# Patient Record
Sex: Female | Born: 1955 | Race: White | Hispanic: No | Marital: Married
Health system: Midwestern US, Community
[De-identification: ages and names within clinical notes are randomized; demographics above are authoritative.]

## PROBLEM LIST (undated history)

## (undated) DIAGNOSIS — M19011 Primary osteoarthritis, right shoulder: Secondary | ICD-10-CM

## (undated) DIAGNOSIS — G8929 Other chronic pain: Secondary | ICD-10-CM

## (undated) DIAGNOSIS — M5416 Radiculopathy, lumbar region: Principal | ICD-10-CM

## (undated) DIAGNOSIS — M4120 Other idiopathic scoliosis, site unspecified: Secondary | ICD-10-CM

## (undated) DIAGNOSIS — M545 Low back pain, unspecified: Secondary | ICD-10-CM

---

## 2015-03-10 ENCOUNTER — Inpatient Hospital Stay: Admit: 2015-03-10 | Payer: BLUE CROSS/BLUE SHIELD | Primary: Internal Medicine

## 2015-03-10 DIAGNOSIS — Z01812 Encounter for preprocedural laboratory examination: Secondary | ICD-10-CM

## 2015-03-10 LAB — POTASSIUM: Potassium: 3.5 mmol/L (ref 3.5–5.1)

## 2015-03-10 NOTE — Other (Signed)
Patient verified name, DOB, and surgery as listed in [facility].    TYPE  CASE:1b  Orders per surgeon:  Received  Labs per surgeon:none  Labs per anesthesia protocol: potassium EKG  :  Not needed      Patient provided with handouts including guide to surgery , transfusions, pain management and hand hygiene for the family and community. Pt verbalizes understanding of all pre-op instructions .  Instructed that family must be present in building at all times.    Hibiclens and instructions given per hospital policy.      Instructed patient to continue  previous medications as prescribed prior to surgery and  to take the following medications the day of surgery according to anesthesia guidelines : allegra, singulair         Continue all previous medications unless otherwise directed.      Instructed patient to hold  the following medications prior to surgery: diclofenac, multivitamin      Patient verbalized understanding of all instructions and provided all medical/health information to the best of their ability.

## 2015-03-11 ENCOUNTER — Inpatient Hospital Stay: Payer: BLUE CROSS/BLUE SHIELD

## 2015-03-11 MED ORDER — DIPHENHYDRAMINE HCL 50 MG/ML IJ SOLN
50 mg/mL | Freq: Once | INTRAMUSCULAR | Status: DC | PRN
Start: 2015-03-11 — End: 2015-03-11

## 2015-03-11 MED ORDER — SODIUM CHLORIDE 0.9 % INJECTION
INTRAMUSCULAR | Status: AC
Start: 2015-03-11 — End: ?

## 2015-03-11 MED ORDER — ALBUTEROL SULFATE 0.083 % (0.83 MG/ML) SOLN FOR INHALATION
2.5 mg /3 mL (0.083 %) | RESPIRATORY_TRACT | Status: DC | PRN
Start: 2015-03-11 — End: 2015-03-11

## 2015-03-11 MED ORDER — ONDANSETRON (PF) 4 MG/2 ML INJECTION
4 mg/2 mL | Freq: Once | INTRAMUSCULAR | Status: DC
Start: 2015-03-11 — End: 2015-03-11

## 2015-03-11 MED ORDER — ROPIVACAINE (PF) 5 MG/ML (0.5 %) INJECTION
5 mg/mL (0. %) | INTRAMUSCULAR | Status: DC | PRN
Start: 2015-03-11 — End: 2015-03-11
  Administered 2015-03-11: 12:00:00 via PERINEURAL

## 2015-03-11 MED ORDER — LACTATED RINGERS IV
INTRAVENOUS | Status: DC
Start: 2015-03-11 — End: 2015-03-11

## 2015-03-11 MED ORDER — LIDOCAINE HCL 1 % (10 MG/ML) IJ SOLN
10 mg/mL (1 %) | INTRAMUSCULAR | Status: DC | PRN
Start: 2015-03-11 — End: 2015-03-11

## 2015-03-11 MED ORDER — VANCOMYCIN 1,000 MG IV SOLR
1000 mg | Freq: Once | INTRAVENOUS | Status: AC
Start: 2015-03-11 — End: 2015-03-11
  Administered 2015-03-11: 11:00:00 via INTRAVENOUS

## 2015-03-11 MED ORDER — LACTATED RINGERS IV
INTRAVENOUS | Status: DC
Start: 2015-03-11 — End: 2015-03-11
  Administered 2015-03-11: 11:00:00 via INTRAVENOUS

## 2015-03-11 MED ORDER — EPINEPHRINE (PF) 1 MG/ML INJECTION
1 mg/mL ( mL) | INTRAMUSCULAR | Status: AC
Start: 2015-03-11 — End: ?

## 2015-03-11 MED ORDER — EPINEPHRINE 1 MG/ML IJ SOLN
1 mg/mL | INTRAMUSCULAR | Status: DC | PRN
Start: 2015-03-11 — End: 2015-03-11
  Administered 2015-03-11: 12:00:00

## 2015-03-11 MED ORDER — OXYCODONE 5 MG TAB
5 mg | Freq: Once | ORAL | Status: DC | PRN
Start: 2015-03-11 — End: 2015-03-11

## 2015-03-11 MED ORDER — PROPOFOL 10 MG/ML IV EMUL
10 mg/mL | INTRAVENOUS | Status: AC
Start: 2015-03-11 — End: ?

## 2015-03-11 MED ORDER — NALOXONE 0.4 MG/ML INJECTION
0.4 mg/mL | INTRAMUSCULAR | Status: DC | PRN
Start: 2015-03-11 — End: 2015-03-11

## 2015-03-11 MED ORDER — HYDROMORPHONE (PF) 2 MG/ML IJ SOLN
2 mg/mL | INTRAMUSCULAR | Status: DC | PRN
Start: 2015-03-11 — End: 2015-03-11

## 2015-03-11 MED ORDER — PROPOFOL 10 MG/ML IV EMUL
10 mg/mL | INTRAVENOUS | Status: DC | PRN
Start: 2015-03-11 — End: 2015-03-11
  Administered 2015-03-11: 13:00:00 via INTRAVENOUS

## 2015-03-11 MED ORDER — ONDANSETRON (PF) 4 MG/2 ML INJECTION
4 mg/2 mL | INTRAMUSCULAR | Status: AC
Start: 2015-03-11 — End: ?

## 2015-03-11 MED ORDER — MIDAZOLAM 1 MG/ML IJ SOLN
1 mg/mL | Freq: Once | INTRAMUSCULAR | Status: AC
Start: 2015-03-11 — End: 2015-03-11
  Administered 2015-03-11: 12:00:00 via INTRAVENOUS

## 2015-03-11 MED ORDER — MIDAZOLAM 1 MG/ML IJ SOLN
1 mg/mL | Freq: Once | INTRAMUSCULAR | Status: DC | PRN
Start: 2015-03-11 — End: 2015-03-11

## 2015-03-11 MED ORDER — BUPIVACAINE-EPINEPHRINE (PF) 0.25 %-1:200,000 IJ SOLN
0.25 %-1:200,000 | INTRAMUSCULAR | Status: DC | PRN
Start: 2015-03-11 — End: 2015-03-11
  Administered 2015-03-11: 12:00:00 via PERINEURAL

## 2015-03-11 MED ORDER — LIDOCAINE (PF) 20 MG/ML (2 %) IJ SOLN
20 mg/mL (2 %) | INTRAMUSCULAR | Status: DC | PRN
Start: 2015-03-11 — End: 2015-03-11
  Administered 2015-03-11: 13:00:00 via INTRAVENOUS

## 2015-03-11 MED ORDER — PHENYLEPHRINE 10 MG/ML INJECTION
10 mg/mL | INTRAMUSCULAR | Status: AC
Start: 2015-03-11 — End: ?

## 2015-03-11 MED ORDER — DEXAMETHASONE SODIUM PHOSPHATE 4 MG/ML IJ SOLN
4 mg/mL | INTRAMUSCULAR | Status: DC | PRN
Start: 2015-03-11 — End: 2015-03-11
  Administered 2015-03-11: 13:00:00 via INTRAVENOUS

## 2015-03-11 MED ORDER — ONDANSETRON (PF) 4 MG/2 ML INJECTION
4 mg/2 mL | INTRAMUSCULAR | Status: DC | PRN
Start: 2015-03-11 — End: 2015-03-11
  Administered 2015-03-11: 13:00:00 via INTRAVENOUS

## 2015-03-11 MED ORDER — KETOROLAC TROMETHAMINE 30 MG/ML INJECTION
30 mg/mL (1 mL) | INTRAMUSCULAR | Status: DC
Start: 2015-03-11 — End: 2015-03-11

## 2015-03-11 MED ORDER — DEXAMETHASONE SODIUM PHOSPHATE 4 MG/ML IJ SOLN
4 mg/mL | INTRAMUSCULAR | Status: AC
Start: 2015-03-11 — End: ?

## 2015-03-11 MED ORDER — LIDOCAINE (PF) 20 MG/ML (2 %) IV SYRINGE
100 mg/5 mL (2 %) | INTRAVENOUS | Status: AC
Start: 2015-03-11 — End: ?

## 2015-03-11 MED ORDER — EPHEDRINE SULFATE 50 MG/ML IJ SOLN
50 mg/mL | INTRAMUSCULAR | Status: AC
Start: 2015-03-11 — End: ?

## 2015-03-11 MED ORDER — AZTREONAM 2 GRAM SOLUTION FOR INJECTION
2 gram | Freq: Once | INTRAMUSCULAR | Status: AC
Start: 2015-03-11 — End: 2015-03-11
  Administered 2015-03-11 (×2): via INTRAVENOUS

## 2015-03-11 MED ORDER — FENTANYL CITRATE (PF) 50 MCG/ML IJ SOLN
50 mcg/mL | Freq: Once | INTRAMUSCULAR | Status: DC
Start: 2015-03-11 — End: 2015-03-11

## 2015-03-11 MED FILL — PROPOFOL 10 MG/ML IV EMUL: 10 mg/mL | INTRAVENOUS | Qty: 20

## 2015-03-11 MED FILL — VANCOMYCIN 1,000 MG IV SOLR: 1000 mg | INTRAVENOUS | Qty: 1000

## 2015-03-11 MED FILL — ONDANSETRON (PF) 4 MG/2 ML INJECTION: 4 mg/2 mL | INTRAMUSCULAR | Qty: 2

## 2015-03-11 MED FILL — MIDAZOLAM 1 MG/ML IJ SOLN: 1 mg/mL | INTRAMUSCULAR | Qty: 2

## 2015-03-11 MED FILL — AZTREONAM 2 GRAM SOLUTION FOR INJECTION: 2 gram | INTRAMUSCULAR | Qty: 2

## 2015-03-11 MED FILL — LIDOCAINE (PF) 20 MG/ML (2 %) IV SYRINGE: 100 mg/5 mL (2 %) | INTRAVENOUS | Qty: 5

## 2015-03-11 MED FILL — SODIUM CHLORIDE 0.9 % INJECTION: INTRAMUSCULAR | Qty: 20

## 2015-03-11 MED FILL — DEXAMETHASONE SODIUM PHOSPHATE 4 MG/ML IJ SOLN: 4 mg/mL | INTRAMUSCULAR | Qty: 1

## 2015-03-11 MED FILL — PHENYLEPHRINE 10 MG/ML INJECTION: 10 mg/mL | INTRAMUSCULAR | Qty: 1

## 2015-03-11 MED FILL — EPINEPHRINE (PF) 1 MG/ML INJECTION: 1 mg/mL ( mL) | INTRAMUSCULAR | Qty: 2

## 2015-03-11 MED FILL — EPHEDRINE SULFATE 50 MG/ML IJ SOLN: 50 mg/mL | INTRAMUSCULAR | Qty: 1

## 2015-03-11 NOTE — Anesthesia Procedure Notes (Addendum)
Peripheral Block    Start time: 03/11/2015 7:03 AM  End time: 03/11/2015 7:06 AM  Performed by: Tobiah Celestine D  Authorized by: Ferry Matthis D       Pre-procedure:   Indications: at surgeon's request and post-op pain management    Preanesthetic Checklist: patient identified, risks and benefits discussed, site marked, timeout performed, anesthesia consent given and patient being monitored    Timeout Time: 07:03          Block Type:   Block Type:  Interscalene  Laterality:  Right  Monitoring:  Standard ASA monitoring, continuous pulse ox, frequent vital sign checks, heart rate, oxygen and responsive to questions  Injection Technique:  Single shot  Procedures: ultrasound guided    Patient Position: supine  Prep: chlorhexidine    Location:  Interscalene  Needle Type:  Stimuplex  Needle Gauge:  21 G  Needle Localization:  Ultrasound guidance and anatomical landmarks  Medication Injected:  0.25%  bupivacaine  Adds:  Epi 1:200K  Volume (mL):  10  ropivacaine  Volume (mL):  20    Assessment:  Number of attempts:  1  Injection Assessment:  Incremental injection every 5 mL, local visualized surrounding nerve on ultrasound, negative aspiration for blood, no paresthesia, no intravascular symptoms and ultrasound image on chart  Patient tolerance:  Patient tolerated the procedure well with no immediate complications

## 2015-03-11 NOTE — Op Note (Signed)
Dictated.

## 2015-03-11 NOTE — Anesthesia Post-Procedure Evaluation (Signed)
Post-Anesthesia Evaluation and Assessment    Patient: Monique Barker MRN: [id number]  SSN: [id number]    Date of Birth: 08/20/1955  Age: 59 y.o.  Sex: female       Cardiovascular Function/Vital Signs  Visit Vitals   ??? BP 131/71 (BP 1 Location: Left arm, BP Patient Position: Supine)   ??? Pulse 80   ??? Temp 36.4 ??C (97.5 ??F)   ??? Resp 13   ??? SpO2 98%       Patient is status post general anesthesia for Procedure(s):  SHOULDER ARTHROSCOPY RIGHT/SUBACROMINAL DECOMPRESSION/IDTAL CLAVICLE RESECTION.    Nausea/Vomiting: None    Postoperative hydration reviewed and adequate.    Pain:  Pain Scale 1: Numeric (0 - 10) (03/11/15 0850)  Pain Intensity 1: 0 (03/11/15 0850)   Managed    Neurological Status:   Neuro (WDL): Exceptions to WDL (03/11/15 0850)  Neuro  RUE Motor Response: Pharmocologically paralyzed (03/11/15 0850)   At baseline    Mental Status and Level of Consciousness: Arousable    Pulmonary Status:   O2 Device: Room air (03/11/15 0850)   Adequate oxygenation and airway patent    Complications related to anesthesia: None    Post-anesthesia assessment completed. No concerns    Signed By: Waymond CeraJohn D Deacon Gadbois, MD     March 11, 2015

## 2015-03-11 NOTE — Anesthesia Post-Procedure Evaluation (Signed)
Post-Anesthesia Evaluation and Assessment    Patient: Monique Barker MRN: [id number]  SSN: [id number]    Date of Birth: 09/08/1955  Age: 59 y.o.  Sex: female       Cardiovascular Function/Vital Signs  Visit Vitals   ??? BP 133/64   ??? Pulse 90   ??? Temp 36.4 ??C (97.5 ??F)   ??? Resp 14   ??? SpO2 97%       Patient is status post general anesthesia for Procedure(s):  SHOULDER ARTHROSCOPY RIGHT/SUBACROMINAL DECOMPRESSION/IDTAL CLAVICLE RESECTION.    Nausea/Vomiting: None    Postoperative hydration reviewed and adequate.    Pain:  Pain Scale 1: Visual (03/11/15 0820)  Pain Intensity 1: 0 (03/11/15 0820)   Managed    Neurological Status:   Neuro (WDL): Within Defined Limits (03/11/15 0820)   At baseline    Mental Status and Level of Consciousness: Arousable    Pulmonary Status:   O2 Device: Nasal cannula;Oral airway (03/11/15 0820)   Adequate oxygenation and airway patent    Complications related to anesthesia: None    Post-anesthesia assessment completed. No concerns    Signed By: Waymond CeraJohn D Ricki Clack, MD     March 11, 2015

## 2015-03-11 NOTE — Op Note (Signed)
[facility]   OPERATIVE REPORT       Name:  Monique ManeWILLIAMS, Kailei   MR#:  [id number]   DOB:  25-Sep-1955   Account #:  [id number]   Date of Adm:  03/11/2015       DATE OF SURGERY: 03/11/2015      PREOPERATIVE DIAGNOSES:   1. Acromioclavicular arthritis.   2. Impingement, right shoulder.      POSTOPERATIVE DIAGNOSES:   1. Acromioclavicular arthritis.   2. Impingement, right shoulder.      NAME OF PROCEDURE:   1. Arthroscopic distal clavicle excision (Mumford procedure).   2. Arthroscopic subacromial decompression, right shoulder.    SURGEON: Germaine Pomfrethomas E. Kenley Rettinger, MD.    ASSISTANT: Gayla Dossarl Howell, PA.    ANESTHESIA: General.    FLUIDS: Crystalloid.    ESTIMATED BLOOD LOSS: Minimal.    DESCRIPTION OF PROCEDURE: After informed consent, the patient   was brought to the operating room and placed on the table in   supine position. General endotracheal anesthesia was   administered without difficulty. The patient was placed in left   lateral decubitus position. All pressure points were inspected   and padded appropriately. Right upper extremity suspended in   overhead traction. The right shoulder was prepped and draped in   sterile fashion. Glenohumeral joint was insufflated with 50 mL   of sterile saline and a posterior portal was established.   Glenohumeral joint was then arthroscoped in sequential manner.   The biceps tendon, rotator cuff, glenohumeral articular surfaces   were intact. There was a small type 1 SLAP tear, otherwise no   abnormalities in the glenohumeral joint. An anterior portal was   established. The small superior labral tear was debrided back to   a smooth, stable rim. The scope was brought in the subacromial   space. A lateral portal was established. Bursa and proliferative   tissue was excised. The superior surface of the rotator cuff had   some mild fraying, but was otherwise intact. There was an   anterior/inferior acromial slope. There was undersurface    osteophyte formation and degenerative change of the distal   clavicle at the [facility] joint. The undersurface of the acromion was   exposed and an acromioplasty was performed. All the acromion   anterior to the leading edge of distal clavicle was excised to a   depth of 5 to 6 mm and a 2 cm anterior to posterior direction   was removed. This opened up the subacromial space nicely.   Attention was then directed to the distal clavicle. Through both   the anterior and lateral portals, a circumferential distal   clavicle excision was performed. Approximately 10 mm of distal   clavicle was excised. Circumferential excision was verified   arthroscopically. The Mumford procedure was performed without   difficulty. The shoulder was thoroughly irrigated, portals   closed. Sterile dressings were applied. The patient was   extubated and taken to the recovery room in stable condition.      Gayla Dossarl Howell, PA, assisted during the procedure. He was necessary   for patient positioning, wound closure and the assistance with   the major portions of the procedure. His presence decreased the   operative time and potential complication rate.        Germaine PomfretHOMAS E. Kariem Wolfson, MD      [facility] / [facility]   D:  03/11/2015   08:16   T:  03/11/2015   08:33   Job #:  [id number]

## 2015-03-11 NOTE — H&P (Signed)
Outpatient Surgery History and Physical:  Monique Barker was seen and examined.    CHIEF COMPLAINT:   Right shoulder pain.Marland Kitchen.     PE:     Visit Vitals   ??? BP 129/65 (BP 1 Location: Left arm, BP Patient Position: Supine)   ??? Pulse 71   ??? Temp 97.7 ??F (36.5 ??C)   ??? Resp 18   ??? SpO2 98%       Heart:   Regular rhythm      Lungs:  Are clear      Past Medical History:  There are no active problems to display for this patient.      Surgical History:   Past Surgical History   Procedure Laterality Date   ??? Pr abdomen surgery proc unlisted       chole   ??? Hx orthopaedic       left ankle       Social History: Patient  reports that she has never smoked. She does not have any smokeless tobacco history on file. She reports that she does not drink alcohol.    Family History:   Family History   Problem Relation Age of Onset   ??? Diabetes Mother      borddrline   ??? Cancer Mother    ??? Parkinson's Disease Father    ??? Cancer Father      different skin ca   ??? Cancer Sister    ??? Cancer Sister      brain tumor on thalmus       Allergies: Reviewed per EMR  No Known Allergies    Medications:    No current facility-administered medications on file prior to encounter.      No current outpatient prescriptions on file prior to encounter.       The surgery is planned for the right shoulder.        History and physical has been reviewed. The patient has been examined. There have been no significant clinical changes since the completion of the originally dated History and Physical.  Patient identified by surgeon; surgical site was confirmed by patient and surgeon.      The patient is here today for outpatient surgery. I have examined the patient, no changes are noted in the patient's medical status. Necessity for the procedure/care is still present and the history and physical above is current.  See the office notes for the full long term history of the problem.  Please see the recent office notes for the musculoskeletal examination.     Signed By: Olen Cordialarl A Dessa Ledee, PA     March 11, 2015 6:48 AM

## 2015-03-11 NOTE — Anesthesia Pre-Procedure Evaluation (Signed)
Anesthetic History               Review of Systems / Medical History  Patient summary reviewed, nursing notes reviewed and pertinent labs reviewed    Pulmonary                   Neuro/Psych              Cardiovascular                  Exercise tolerance: >4 METS     GI/Hepatic/Renal                Endo/Other             Other Findings              Physical Exam    Airway  Mallampati: II  TM Distance: 4 - 6 cm  Neck ROM: normal range of motion   Mouth opening: Normal     Cardiovascular  Regular rate and rhythm,  S1 and S2 normal,  no murmur, click, rub, or gallop             Dental  No notable dental hx       Pulmonary  Breath sounds clear to auscultation               Abdominal         Other Findings            Anesthetic Plan    ASA: 1  Anesthesia type: general      Post-op pain plan if not by surgeon: peripheral nerve block single    Induction: Intravenous  Anesthetic plan and risks discussed with: Patient and Spouse

## 2015-03-11 NOTE — Other (Signed)
Discharge instructions given to spouse. Pt dressed and sling placed over shirt. Verbalized understanding and opportunity for questions was given. Dr Winifred Olivesherbert okay to discharge at this time. Pt and belongings taken via wheelchair to car.

## 2015-03-11 NOTE — Anesthesia Procedure Notes (Signed)
Peripheral Block    Start time: 03/11/2015 7:03 AM  End time: 03/11/2015 7:06 AM  Performed by: Waymond CeraSHERBERT, Keyden Pavlov D  Authorized by: Oletta DarterSHERBERT, Yoko Mcgahee D       Pre-procedure:   Indications: at surgeon's request and post-op pain management    Preanesthetic Checklist: patient identified, risks and benefits discussed, site marked, timeout performed, anesthesia consent given and patient being monitored    Timeout Time: 07:03          Block Type:   Block Type:  Interscalene  Laterality:  Right  Monitoring:  Standard ASA monitoring, continuous pulse ox, frequent vital sign checks, heart rate, oxygen and responsive to questions  Injection Technique:  Single shot  Procedures: ultrasound guided    Patient Position: supine  Prep: chlorhexidine    Location:  Interscalene  Needle Type:  Stimuplex  Needle Gauge:  21 G  Needle Localization:  Ultrasound guidance and anatomical landmarks  Medication Injected:  0.25%  bupivacaine  Adds:  Epi 1:200K  Volume (mL):  10  ropivacaine  Volume (mL):  20    Assessment:  Number of attempts:  1  Injection Assessment:  Incremental injection every 5 mL, local visualized surrounding nerve on ultrasound, negative aspiration for blood, no paresthesia, no intravascular symptoms and ultrasound image on chart  Patient tolerance:  Patient tolerated the procedure well with no immediate complications

## 2015-03-11 NOTE — Op Note (Signed)
[facility]   OPERATIVE REPORT       Name:  Monique Barker, Monique Barker   MR#:  [id number]   DOB:  August 12, 1955   Account #:  [id number]   Date of Adm:  03/11/2015       DATE OF SURGERY: 03/11/2015      PREOPERATIVE DIAGNOSES:   1. Acromioclavicular arthritis.   2. Impingement, right shoulder.      POSTOPERATIVE DIAGNOSES:   1. Acromioclavicular arthritis.   2. Impingement, right shoulder.      NAME OF PROCEDURE:   1. Arthroscopic distal clavicle excision (Mumford procedure).   2. Arthroscopic subacromial decompression, right shoulder.    SURGEON: Germaine Pomfret, MD.    ASSISTANT: Gayla Doss, PA.    ANESTHESIA: General.    FLUIDS: Crystalloid.    ESTIMATED BLOOD LOSS: Minimal.    DESCRIPTION OF PROCEDURE: After informed consent, the patient   was brought to the operating room and placed on the table in   supine position. General endotracheal anesthesia was   administered without difficulty. The patient was placed in left   lateral decubitus position. All pressure points were inspected   and padded appropriately. Right upper extremity suspended in   overhead traction. The right shoulder was prepped and draped in   sterile fashion. Glenohumeral joint was insufflated with 50 mL   of sterile saline and a posterior portal was established.   Glenohumeral joint was then arthroscoped in sequential manner.   The biceps tendon, rotator cuff, glenohumeral articular surfaces   were intact. There was a small type 1 SLAP tear, otherwise no   abnormalities in the glenohumeral joint. An anterior portal was   established. The small superior labral tear was debrided back to   a smooth, stable rim. The scope was brought in the subacromial   space. A lateral portal was established. Bursa and proliferative   tissue was excised. The superior surface of the rotator cuff had   some mild fraying, but was otherwise intact. There was an   anterior/inferior acromial slope. There was undersurface   osteophyte formation and  degenerative change of the distal   clavicle at the [facility] joint. The undersurface of the acromion was   exposed and an acromioplasty was performed. All the acromion   anterior to the leading edge of distal clavicle was excised to a   depth of 5 to 6 mm and a 2 cm anterior to posterior direction   was removed. This opened up the subacromial space nicely.   Attention was then directed to the distal clavicle. Through both   the anterior and lateral portals, a circumferential distal   clavicle excision was performed. Approximately 10 mm of distal   clavicle was excised. Circumferential excision was verified   arthroscopically. The Mumford procedure was performed without   difficulty. The shoulder was thoroughly irrigated, portals   closed. Sterile dressings were applied. The patient was   extubated and taken to the recovery room in stable condition.      Gayla Doss, PA, assisted during the procedure. He was necessary   for patient positioning, wound closure and the assistance with   the major portions of the procedure. His presence decreased the   operative time and potential complication rate.        Germaine Pomfret, MD      [facility] / [facility]   D:  03/11/2015   08:16   T:  03/11/2015   08:33   Job #:  [id number]

## 2015-03-12 MED FILL — XYLOCAINE-MPF 20 MG/ML (2 %) INJECTION SOLUTION: 20 mg/mL (2 %) | INTRAMUSCULAR | Qty: 50

## 2015-03-12 MED FILL — ONDANSETRON (PF) 4 MG/2 ML INJECTION: 4 mg/2 mL | INTRAMUSCULAR | Qty: 4

## 2015-03-12 MED FILL — DEXAMETHASONE SODIUM PHOSPHATE 4 MG/ML IJ SOLN: 4 mg/mL | INTRAMUSCULAR | Qty: 4

## 2015-03-12 MED FILL — PROPOFOL 10 MG/ML IV EMUL: 10 mg/mL | INTRAVENOUS | Qty: 150

## 2015-03-12 MED FILL — NAROPIN (PF) 5 MG/ML (0.5 %) INJECTION SOLUTION: 5 mg/mL (0. %) | INTRAMUSCULAR | Qty: 10

## 2015-03-12 MED FILL — NAROPIN (PF) 5 MG/ML (0.5 %) INJECTION SOLUTION: 5 mg/mL (0. %) | INTRAMUSCULAR | Qty: 20

## 2015-03-12 MED FILL — SENSORCAINE-MPF/EPINEPHRINE 0.25 %-1:200,000 INJECTION SOLUTION: 0.25 %-1:200,000 | INTRAMUSCULAR | Qty: 10

## 2015-03-12 MED FILL — AZTREONAM 2 GRAM SOLUTION FOR INJECTION: 2 gram | INTRAMUSCULAR | Qty: 2

## 2016-02-28 IMAGING — MG MAMMO SCRN BIL W/CAD TOMO
8 series · 9 of 24 positions shown · non-contrast
Comparison: none

[L MLO]
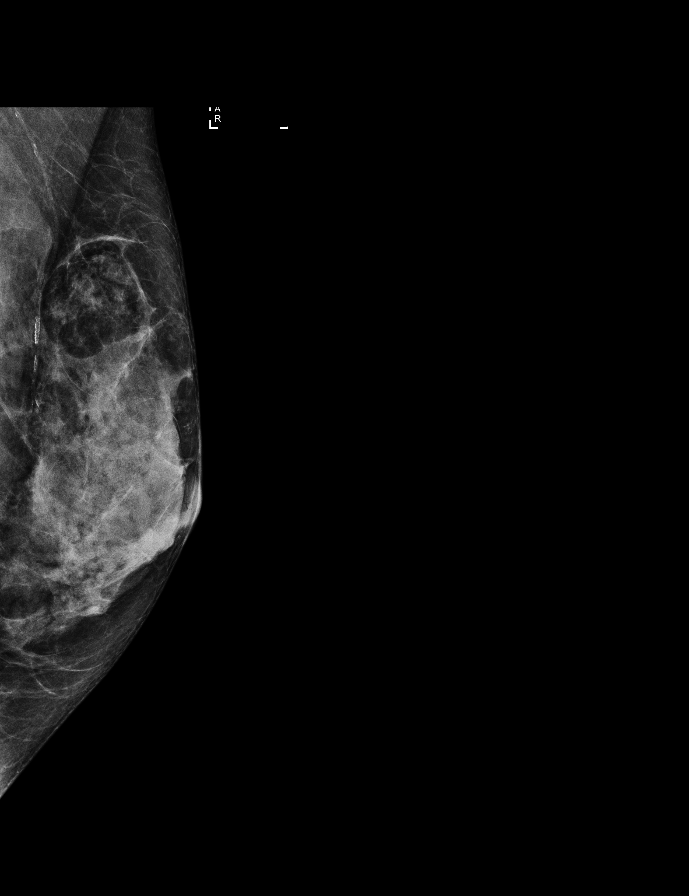

[L CC]
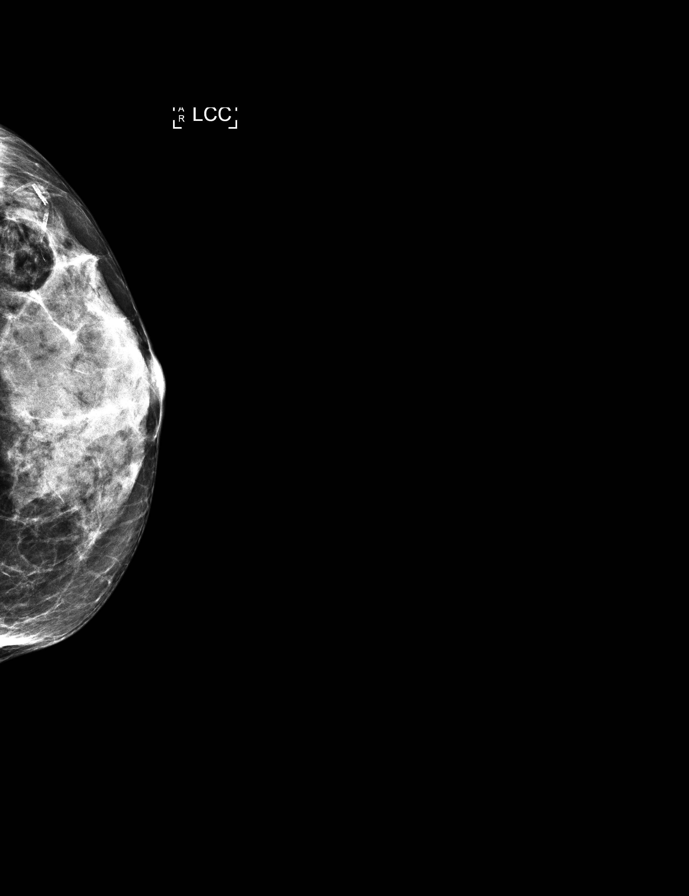

[R CC]
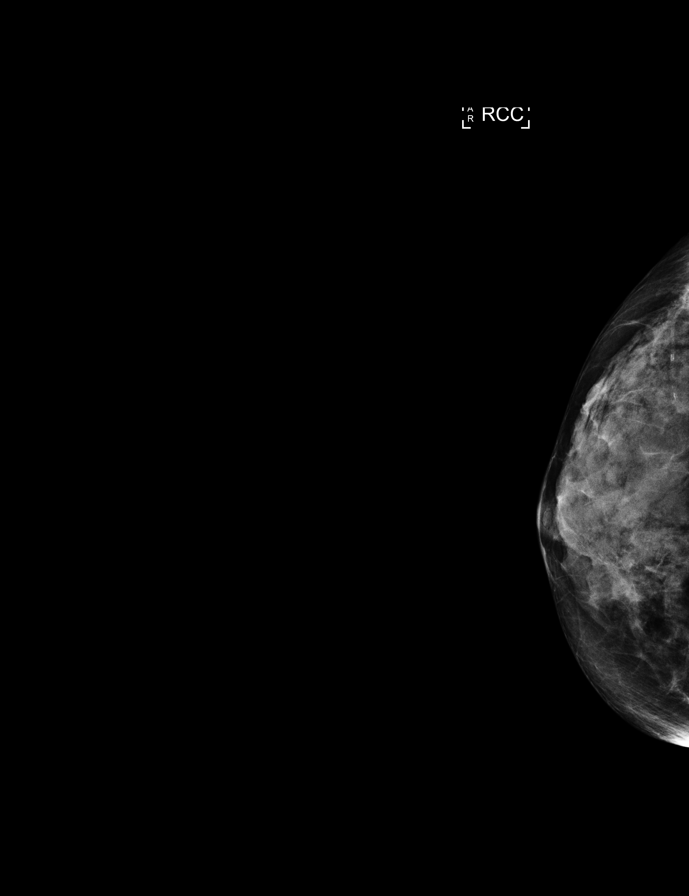

[R MLO]
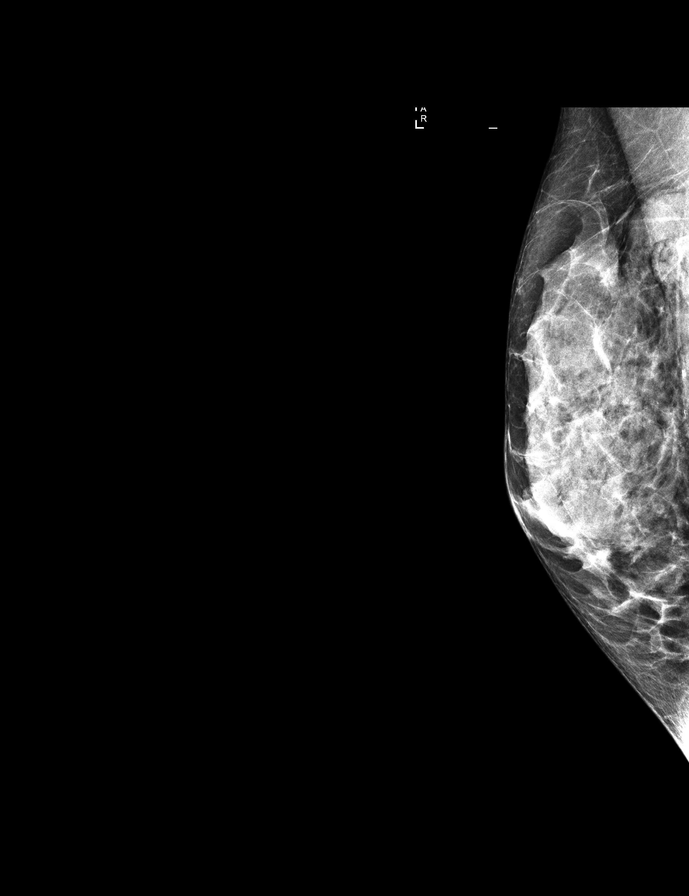

[R CC tomo · 2 of 35 frames shown]
[frame 12/35]
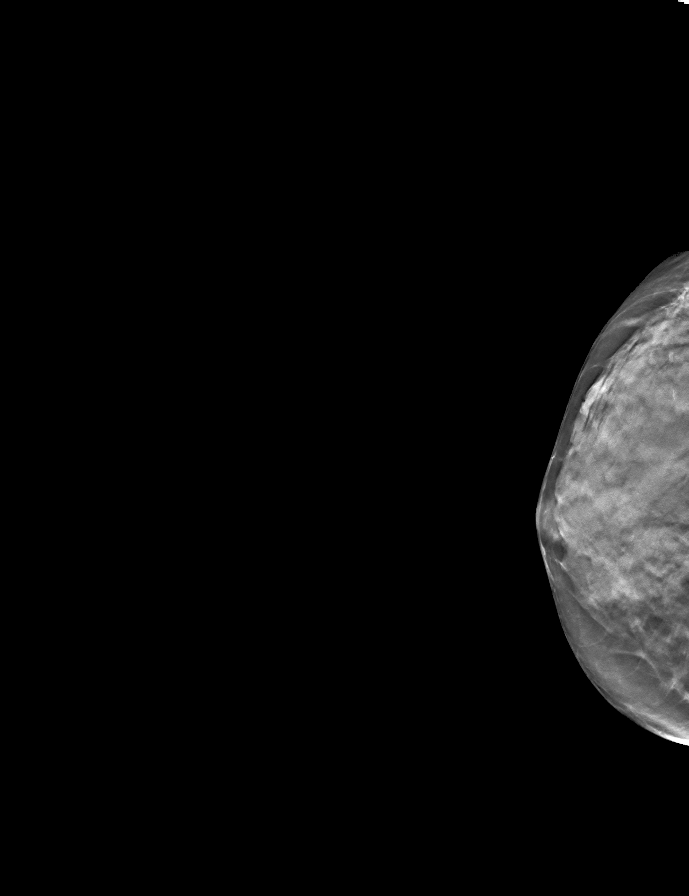
[frame 18/35]
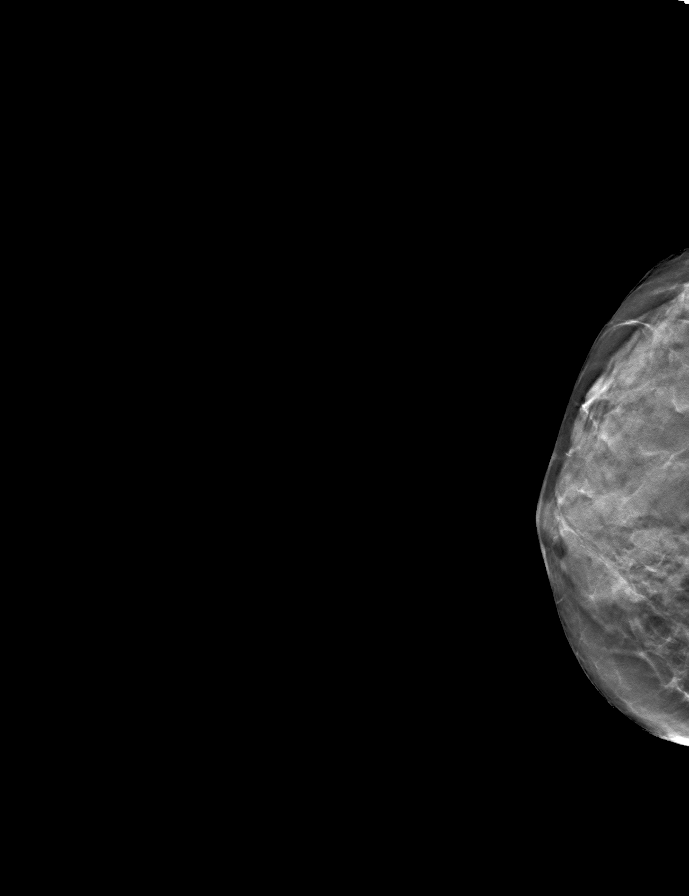

[L MLO tomo · tomo slice 17/34.0]
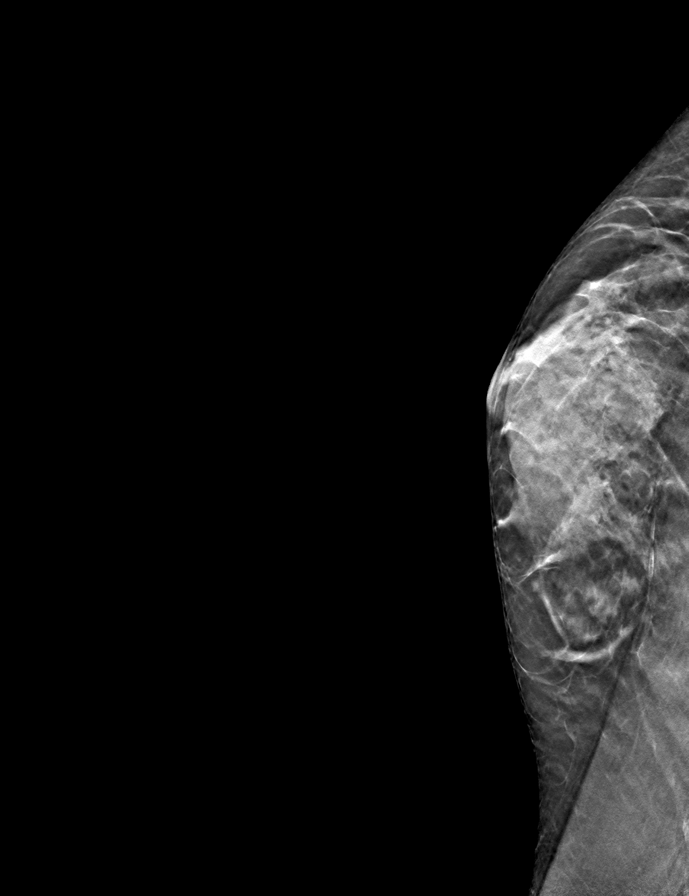

[R MLO tomo · tomo slice 18/35.0]
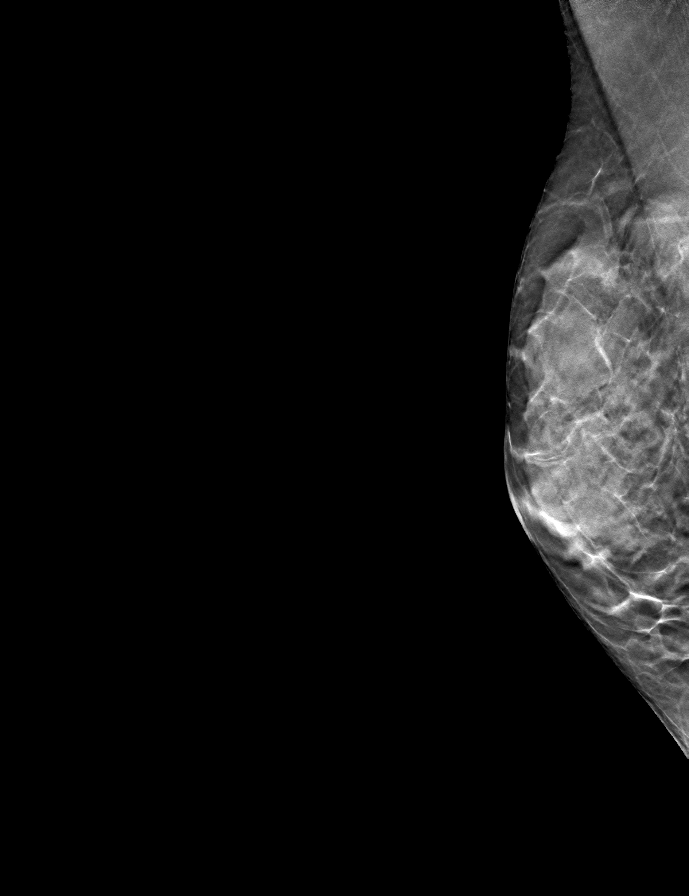

[L CC tomo · tomo slice 17/34.0]
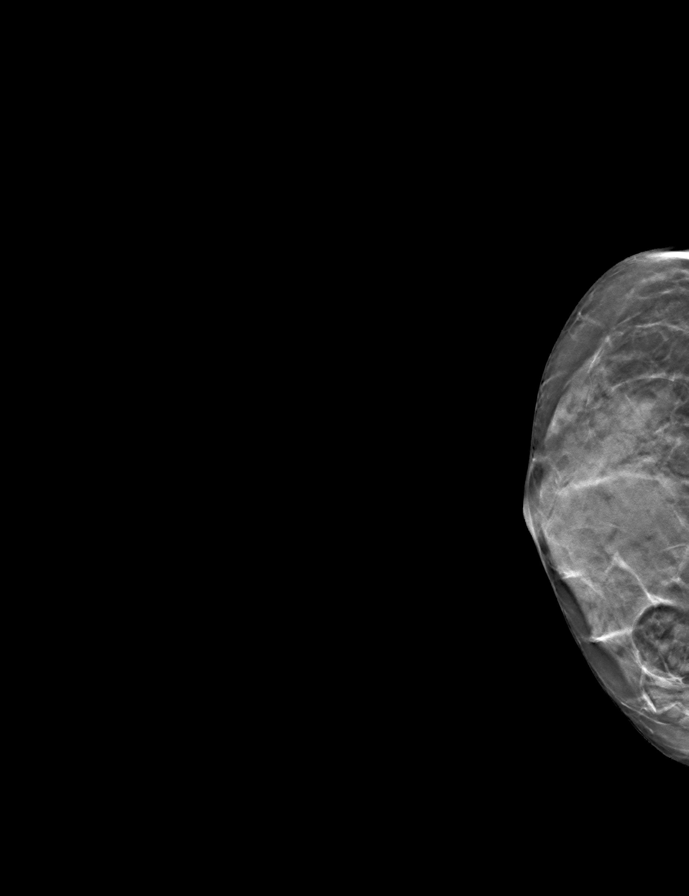

[9 of 24 positions shown; findings below may reference images not displayed]

Images Obtained from [facility]
CLINICAL RA REF: Mammo Scrn bilateral (Digital) W/Cad Routine with Tomosynthesis.
Digital images were generated from the 3D Tomosynthesis data acquired during the exam.
No prior exams were available for comparison.
The breasts are extremely dense, which lowers the sensitivity of mammography.
Current study was also evaluated with a Computer Aided Detection (CAD) system.
There is a benign nodule in the left breast.
No significant masses, calcifications, or other findings are seen in either breast.
Your patient's mammogram demonstrates that she has dense breast tissue, which could hide small abnormalities.  In compliance with [place] the patient has been sent a letter which informs
her that she has dense breast tissue and might benefit from supplementary screening tests depending on her individual risk factors.  The patient may contact you if she has any questions or concerns.
BENIGN
There is no mammographic evidence of malignancy. A 1 year screening mammogram is recommended.
mc/penrad:03/09/2016 [DATE]
letter sent: Normal
FINAL ASSESSMENT: BI-RADS:Category 2: Benign

## 2016-07-04 IMAGING — CR CHEST 2 VWS PA LAT
1 series · 2 of 2 positions shown · non-contrast
Comparison: None available

HISTORY/INDICATIONS: Shortness of breath
TECHNIQUE: Chest 2 views.

[Series 1: pa · 0.17mm/px · 2 of 2 slices shown]
[im 1/2]
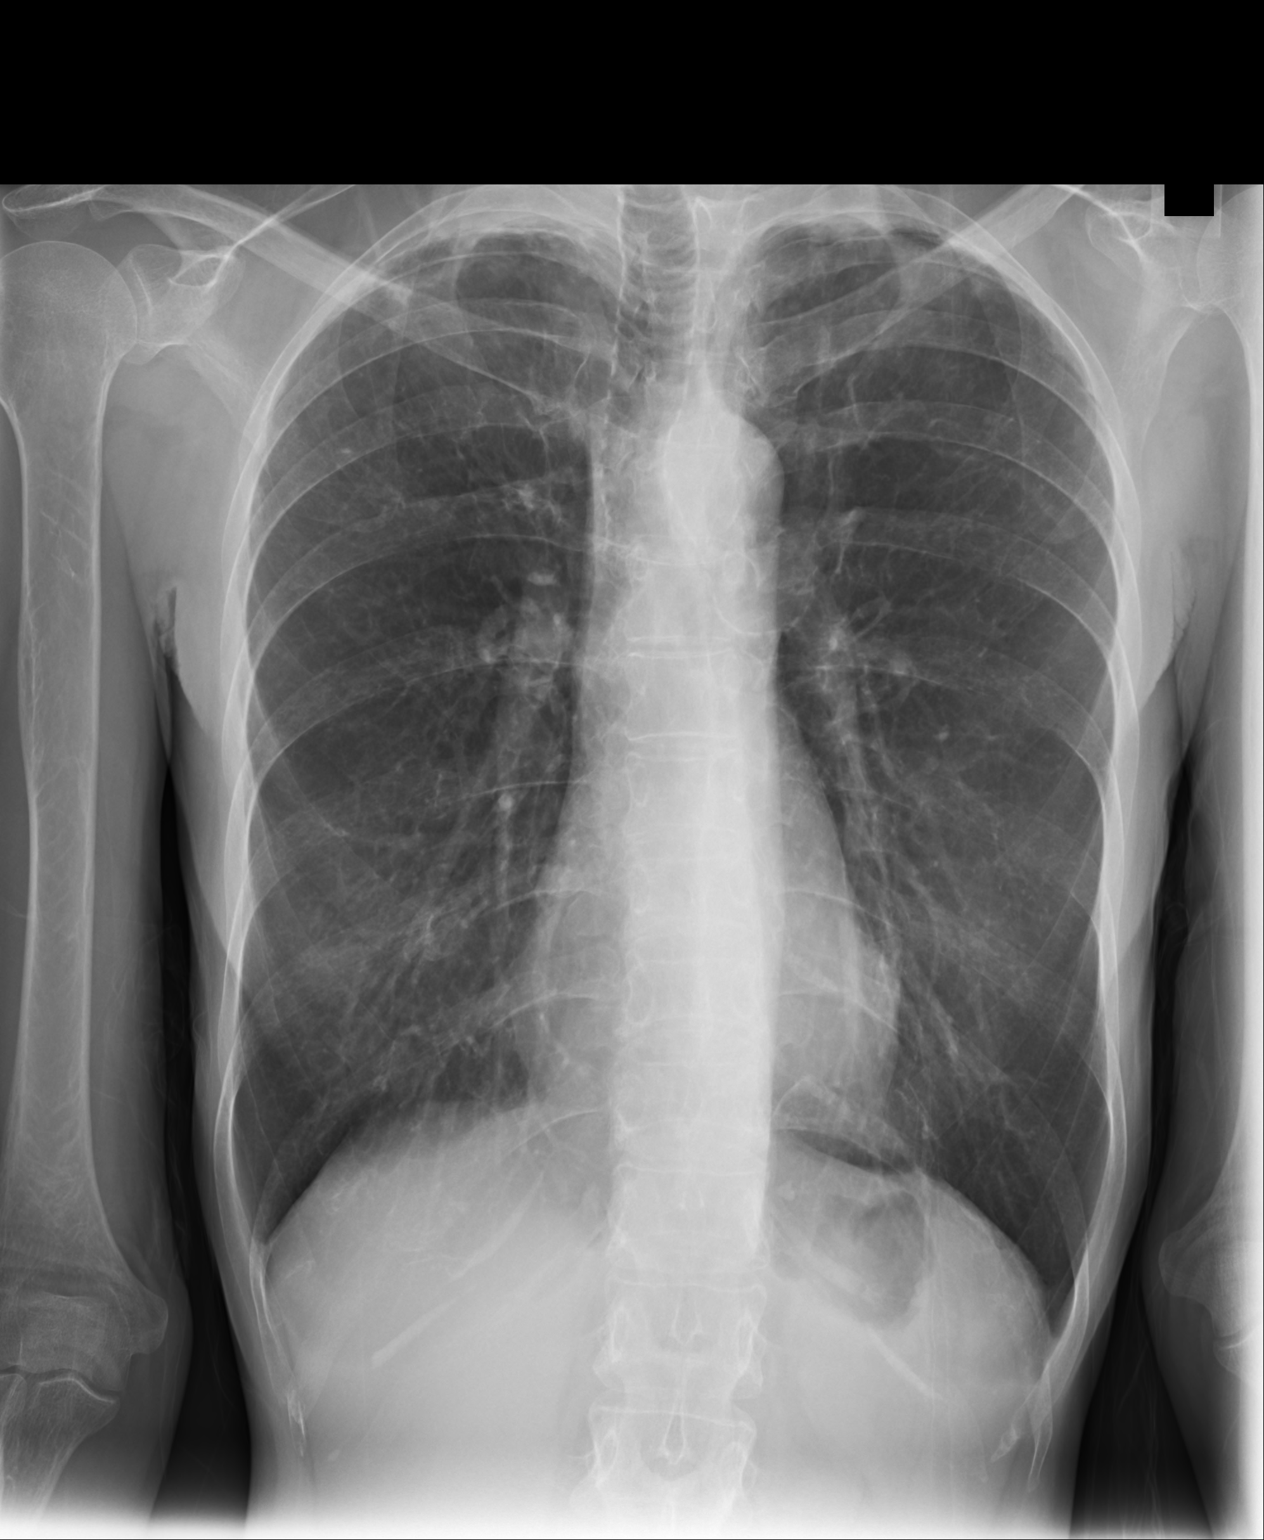
[im 2/2]
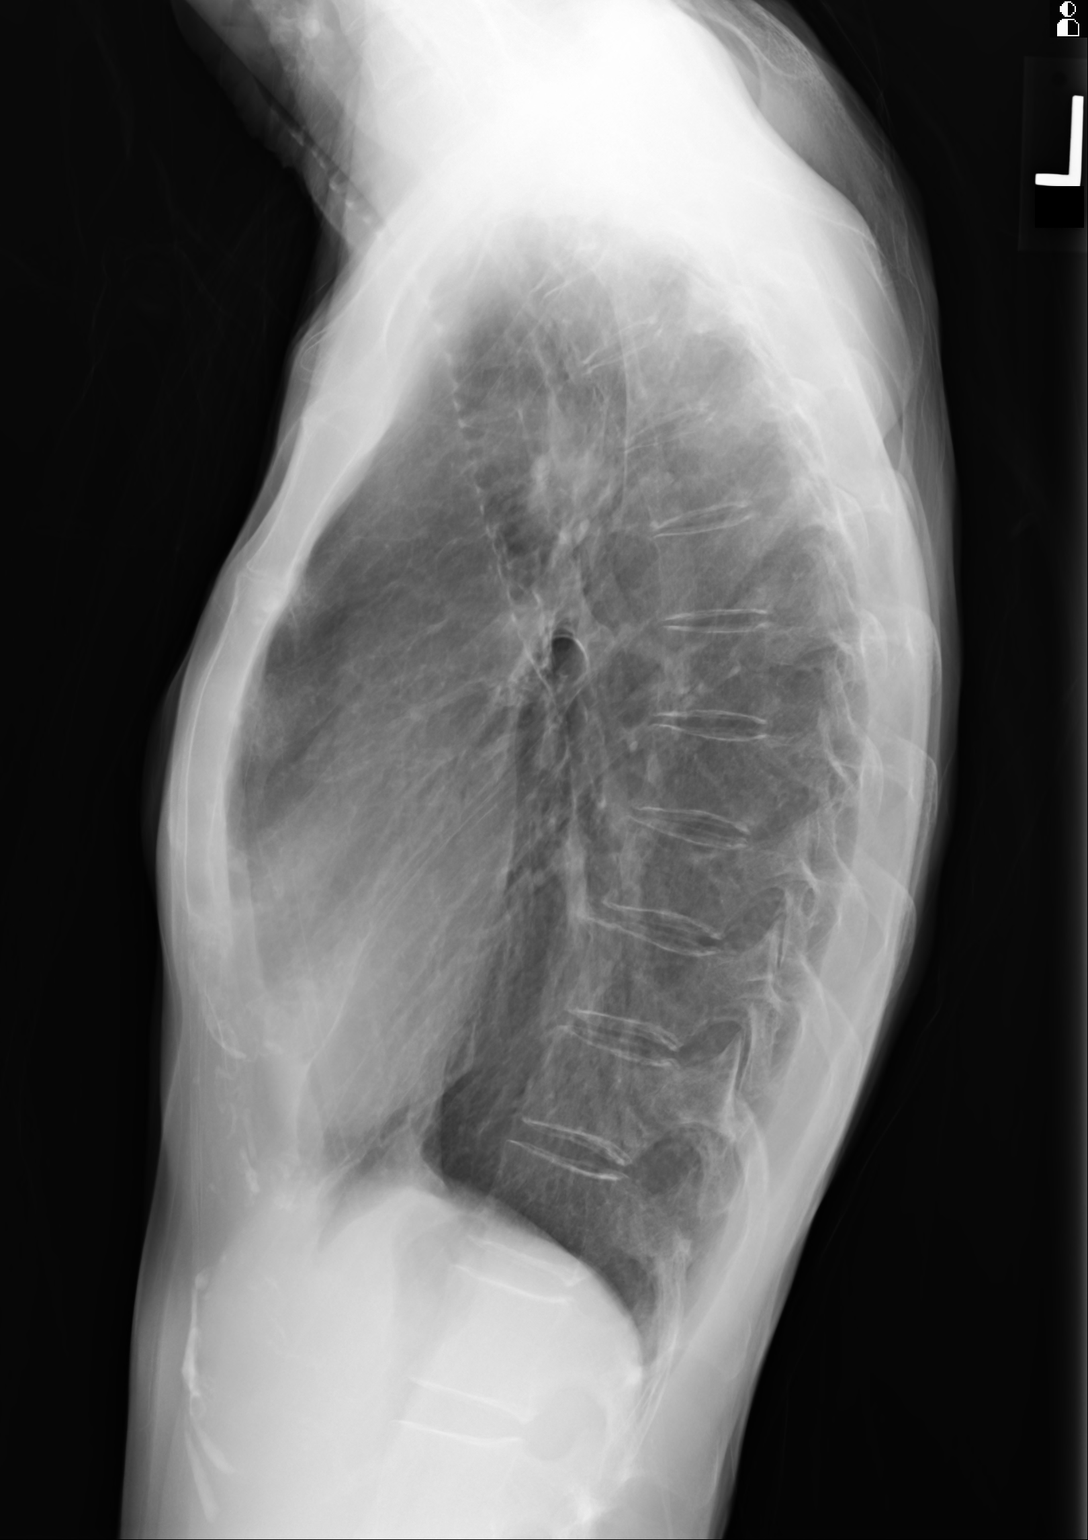

[2 of 2 positions shown; findings below may reference images not displayed]

FINDINGS: Heart size and pulmonary vasculature are normal. Lungs are slightly hyperinflated. There is mild scarring in the apices. Lungs are otherwise clear. No focal lung consolidation, pleural effusion or pneumothorax is identified.
IMPRESSION: Emphysematous change, no acute cardiopulmonary findings.

## 2018-08-12 LAB — HEMOGLOBIN A1C: Hemoglobin A1C, External: 5.5 % (ref 3.2–7.0)

## 2018-12-26 IMAGING — CR CHEST 2 VWS PA LAT
1 series · 2 of 2 positions shown · non-contrast
Comparison: 07/04/2016

HISTORY: 63 years old Female with Chronic obstructive pulmonary disease
TECHNIQUE: 2 view radiograph of the chest.

[Series 1: pa · 0.17mm/px · 2 of 2 slices shown]
[im 1/2]
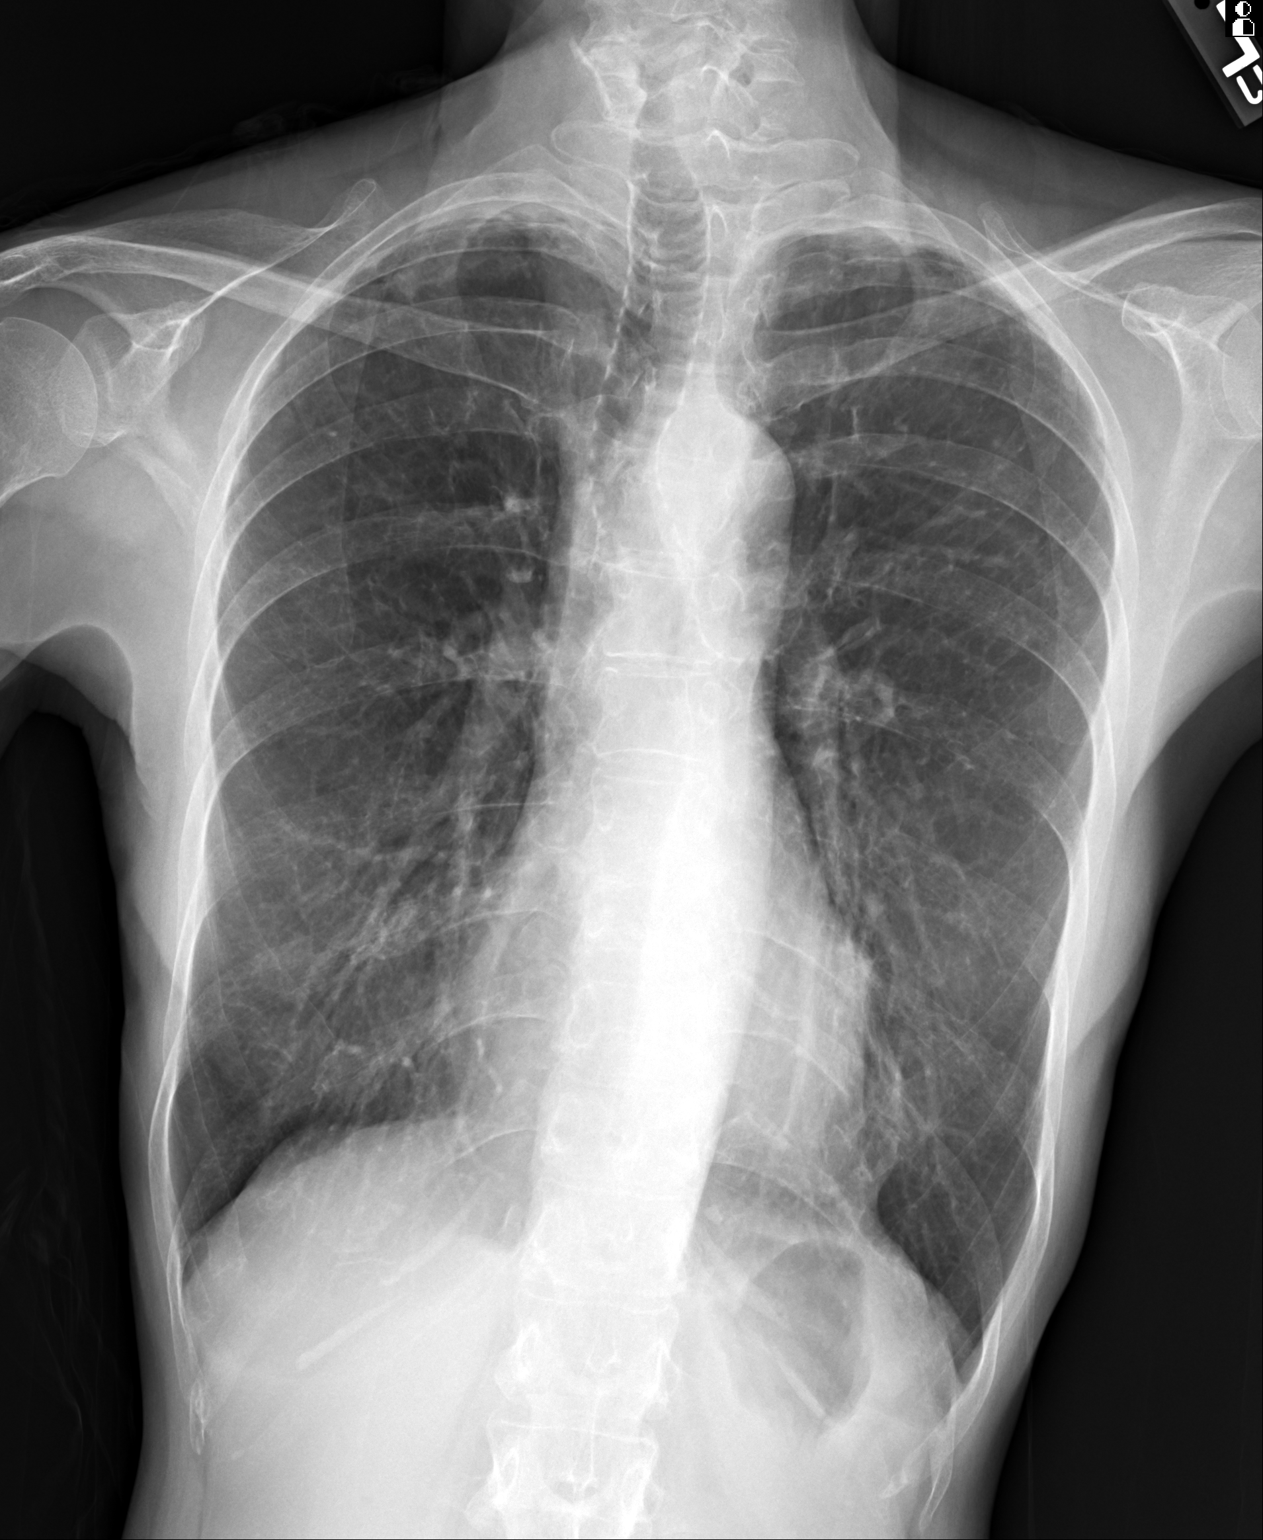
[im 2/2]
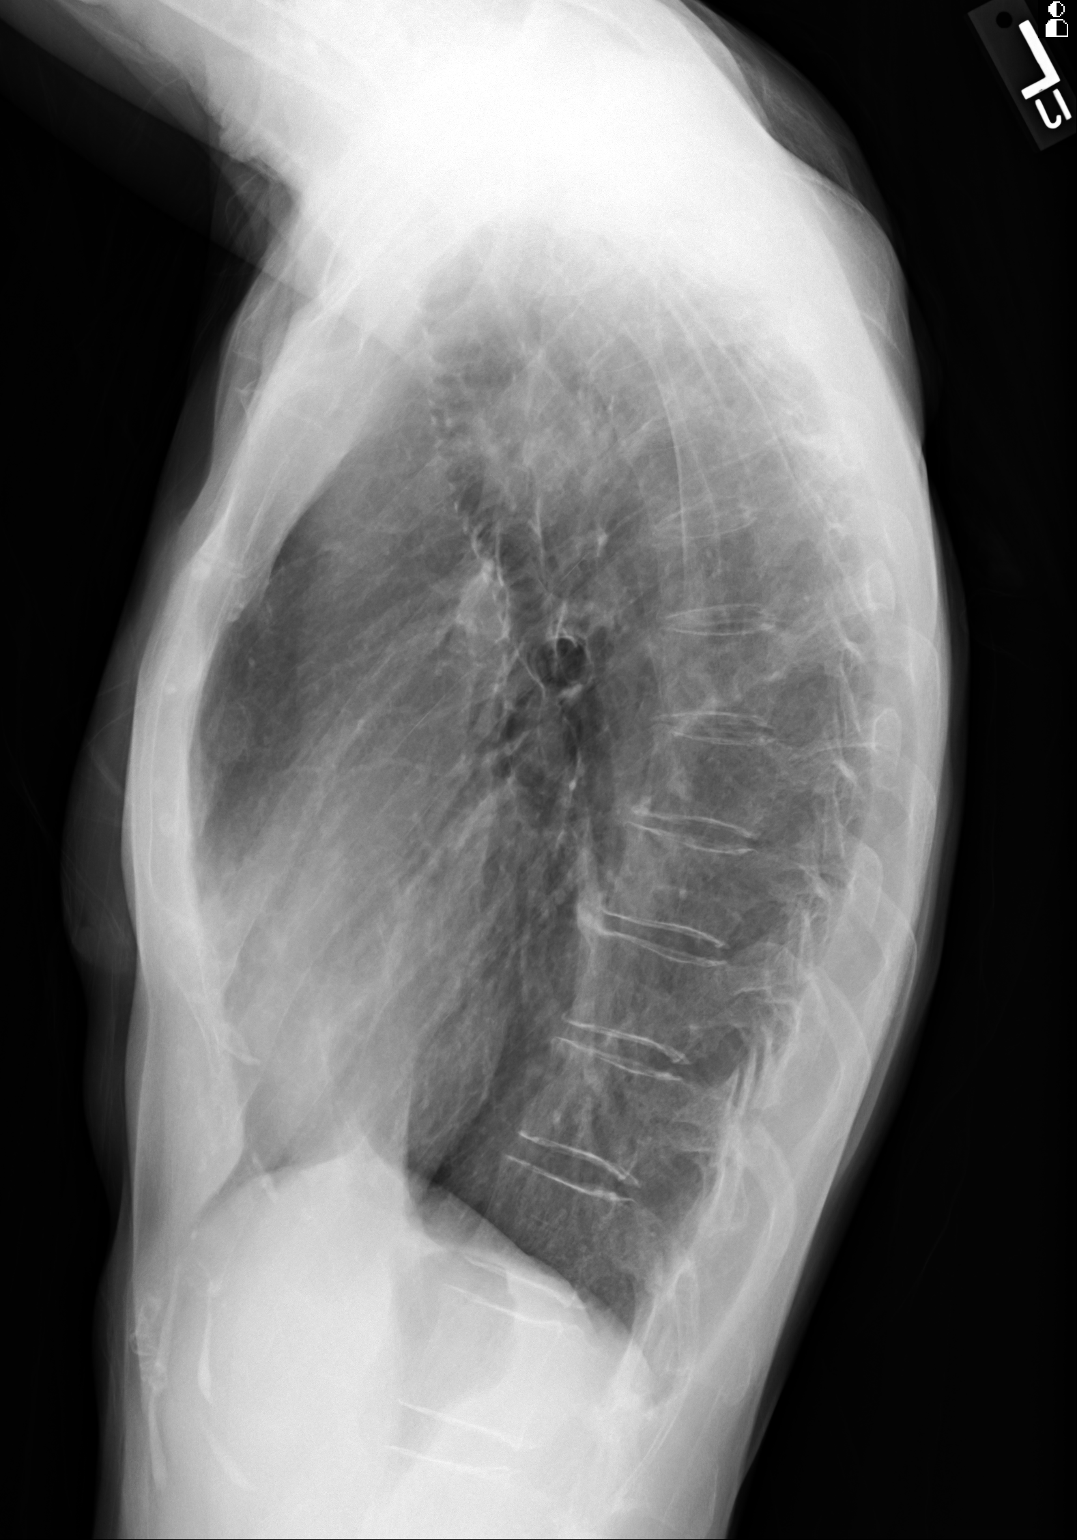

[2 of 2 positions shown; findings below may reference images not displayed]

FINDINGS: The cardiomediastinal silhouette is within normal limits. The lungs are hyperinflated and demonstrate emphysematous changes in the upper lobes. No pulmonary consolidations. No pleural effusions or pneumothorax. No acute osseous abnormalities.
IMPRESSION: Emphysematous changes. No pulmonary consolidations.

## 2019-08-05 IMAGING — CR CHEST 2 VWS PA LAT
2 series · 2 of 2 positions shown · non-contrast
Comparison: 12/26/2018.

HISTORY/INDICATIONS:  Shortness of breath.
TECHNIQUE: Chest, 2 views.

[PA]
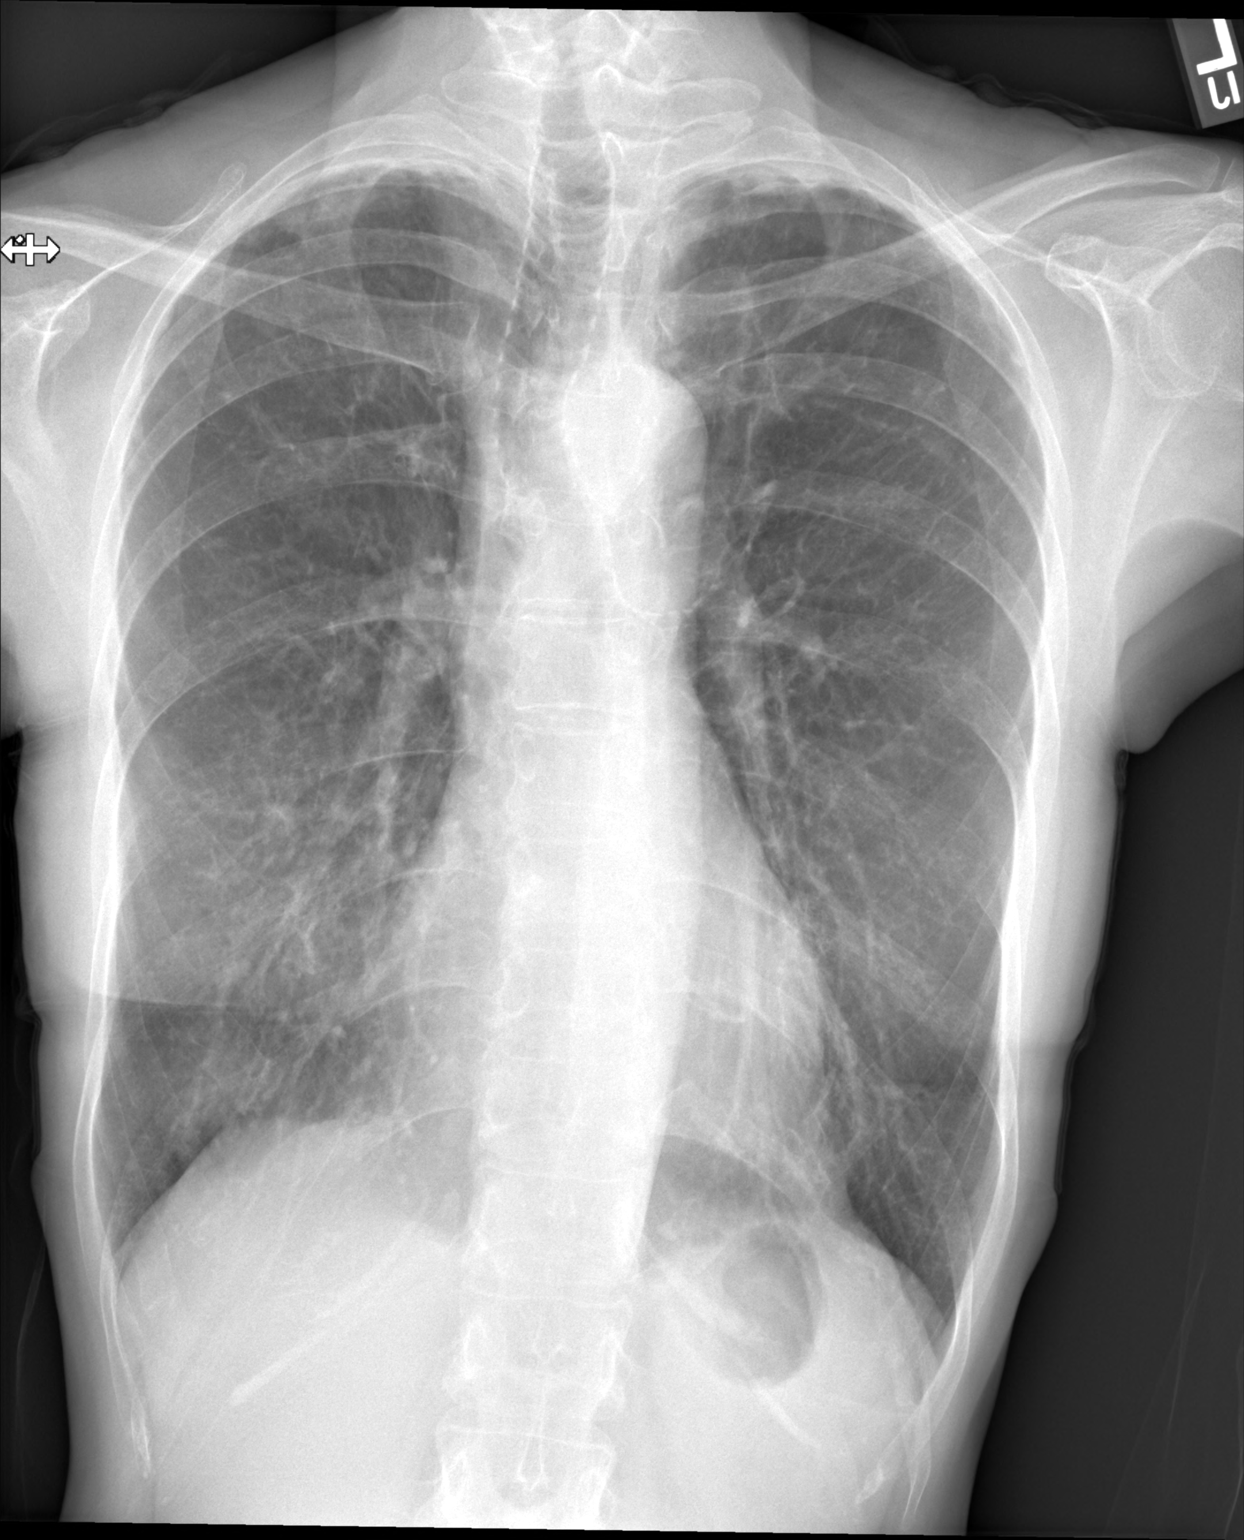

[left lateral]
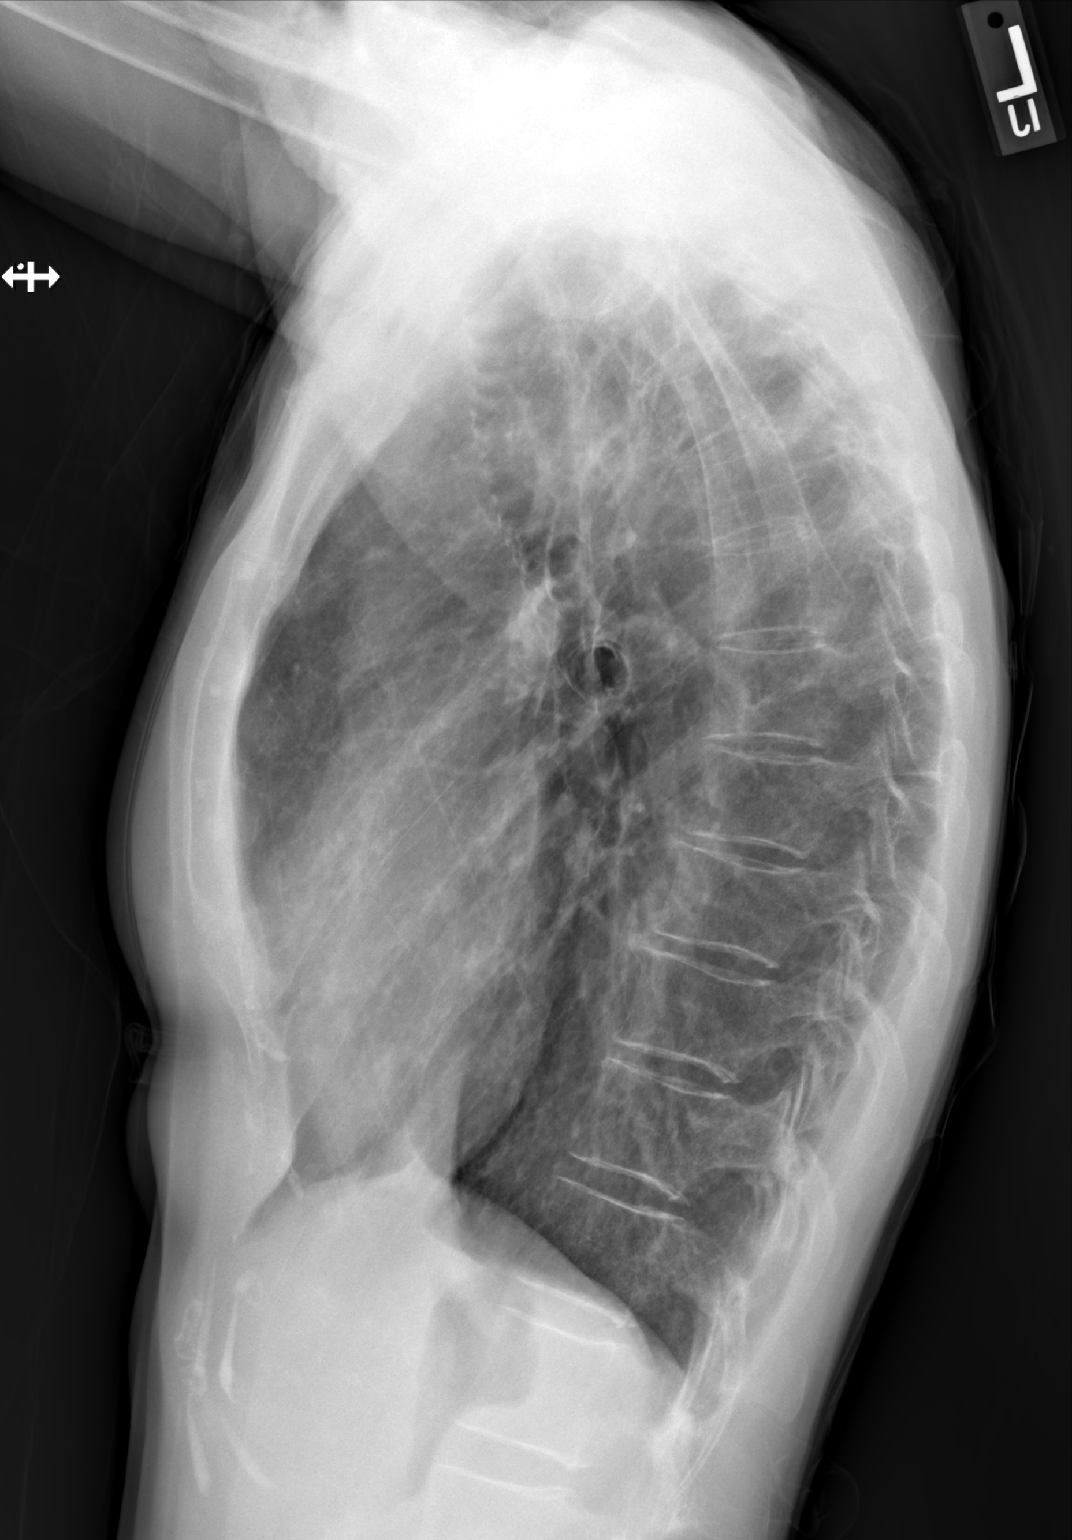

[2 of 2 positions shown; findings below may reference images not displayed]

FINDINGS: No adverse change. Lungs remain moderate to markedly overexpanded with chronic interstitial and fibrocalcific changes with apical pleural thickening. Heart size normal. Ectatic calcified aorta. No effusion.
IMPRESSION: Stable chest.

## 2019-09-28 LAB — HEMOGLOBIN A1C: Hemoglobin A1C, External: 5.7 % (ref 3.2–7.0)

## 2019-12-31 IMAGING — CT CT THORAX WO/W CONTRAST
2 of 4 series · 15 of 36 positions shown, 18 images · non-contrast
Comparison: none

[Series 2: pre contrast · axial · non-contrast · 0.52mm/px · z∈[+118,+383]mm · 12 of 63 slices shown, 15 images]
[im 5/63  mediastinal]
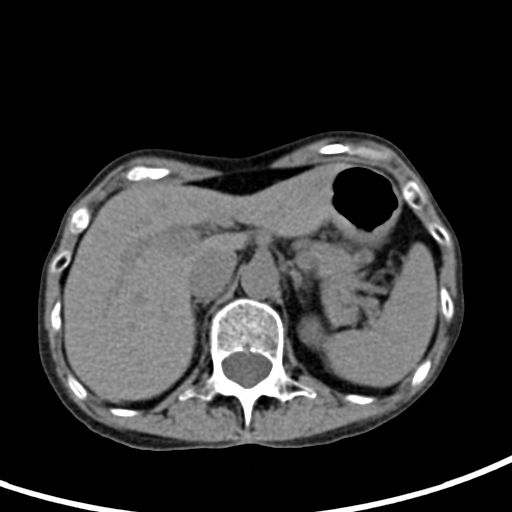
[im 5/63  lung]
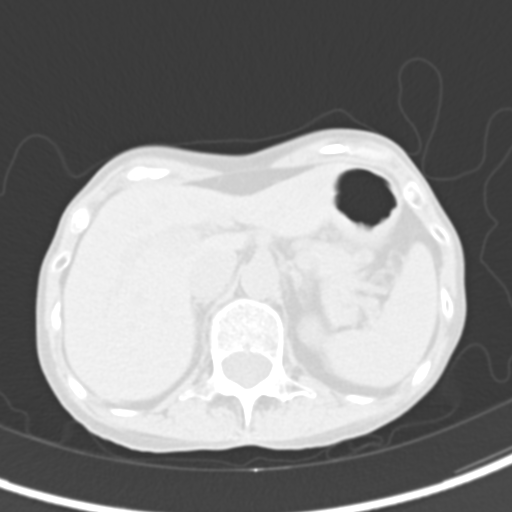
[im 10/63  lung]
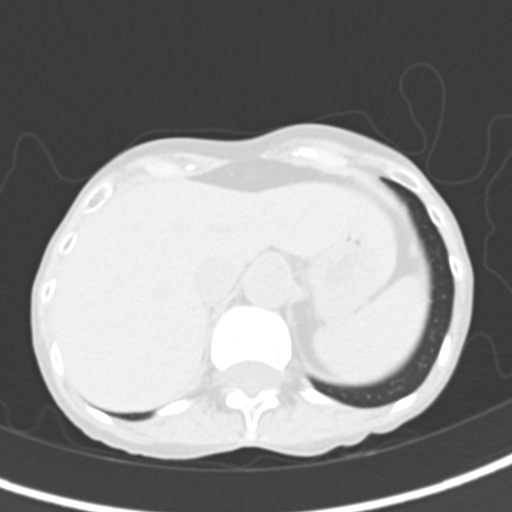
[im 15/63  lung]
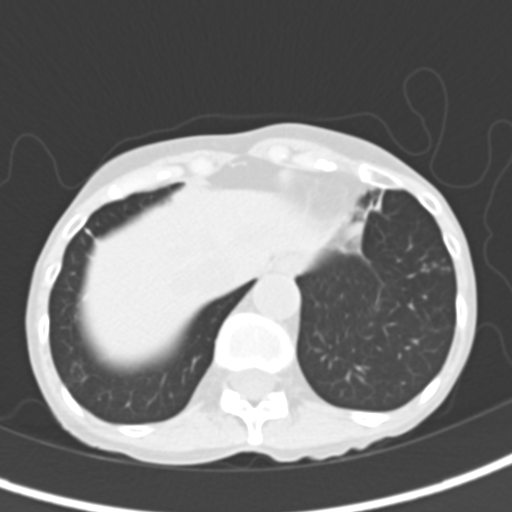
[im 20/63  lung]
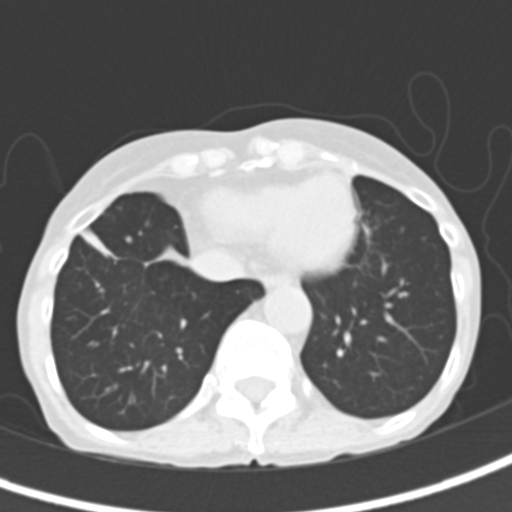
[im 24/63  mediastinal]
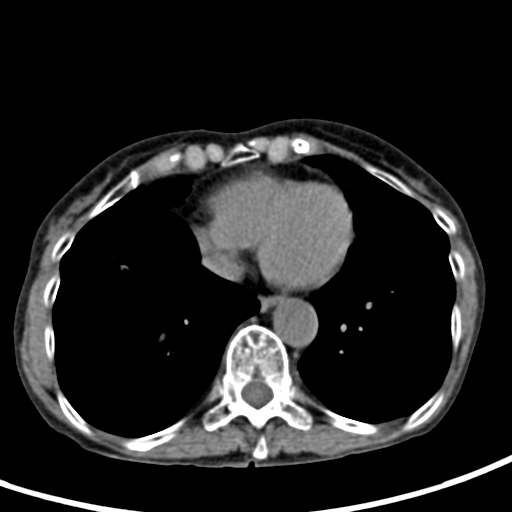
[im 24/63  lung]
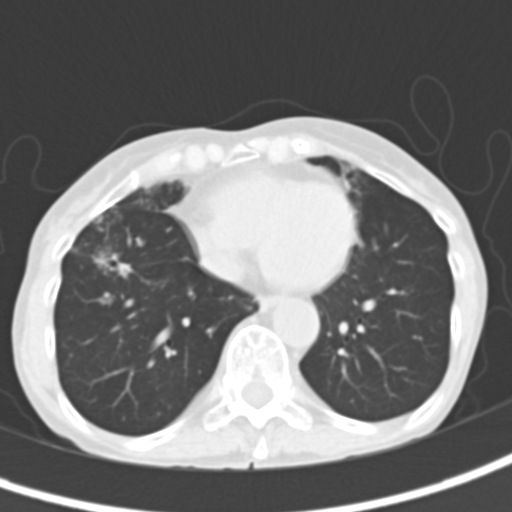
[im 29/63  lung]
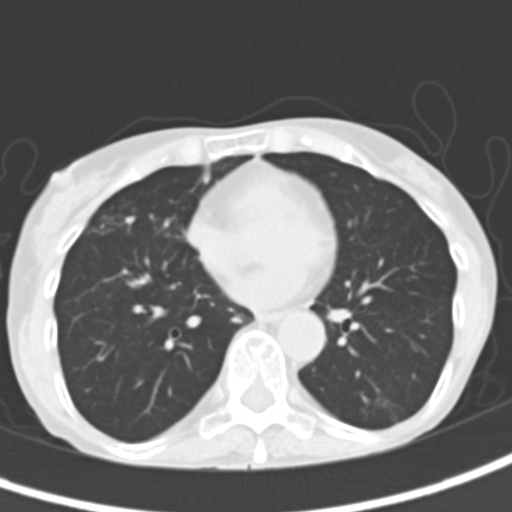
[im 34/63  lung]
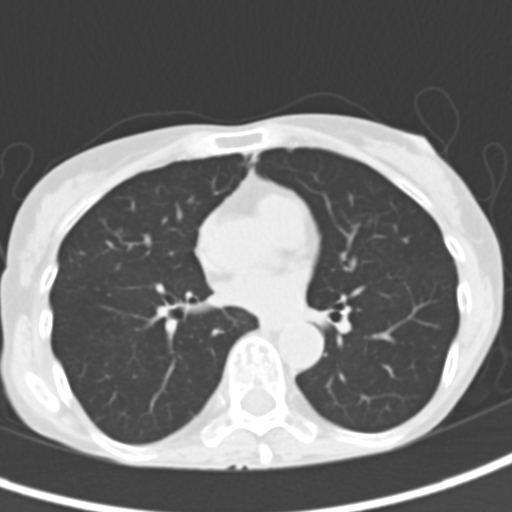
[im 39/63  lung]
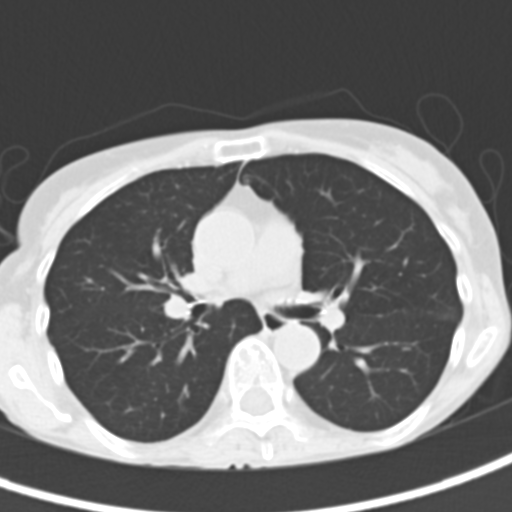
[im 43/63  mediastinal]
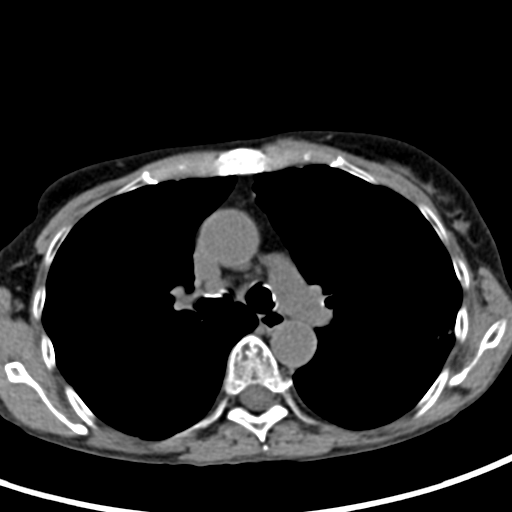
[im 43/63  lung]
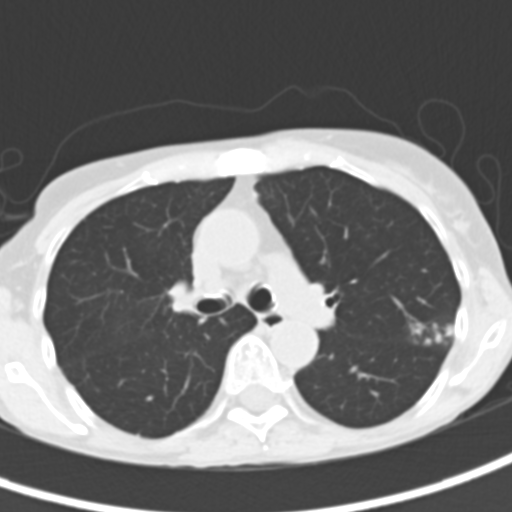
[im 48/63  lung]
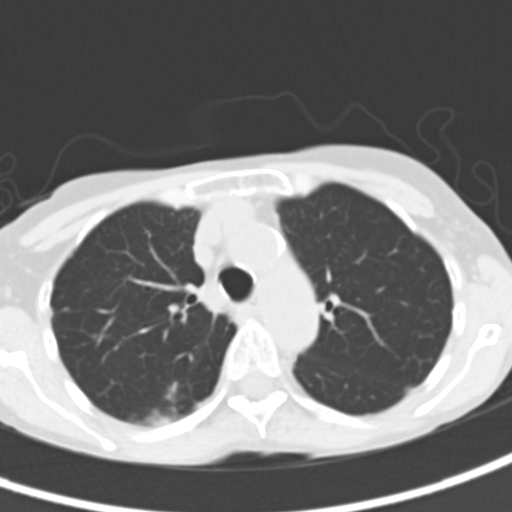
[im 53/63  lung]
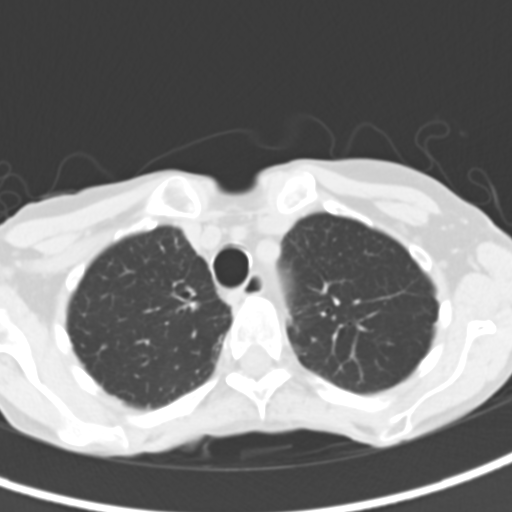
[im 58/63  lung]
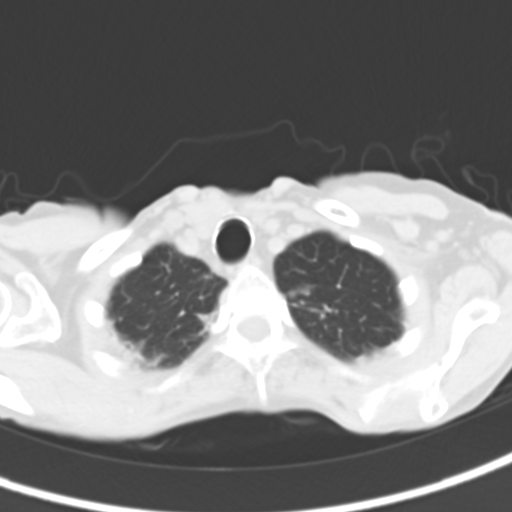

[Series 5: coronal · coronal · 0.53mm/px · 3 of 40 slices shown]
[im 8/40  lung]
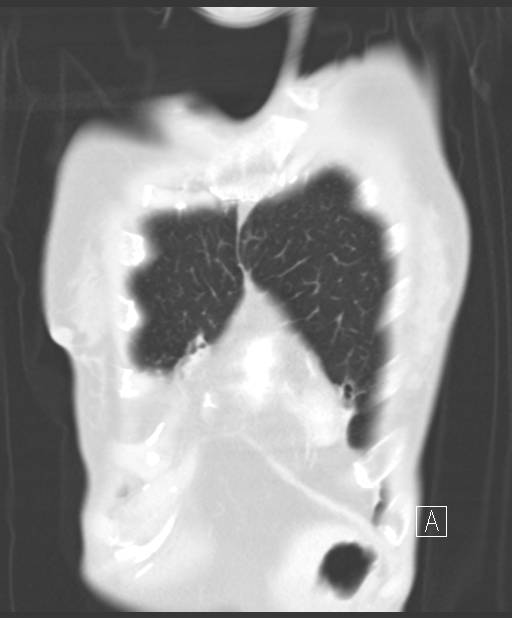
[im 16/40  lung]
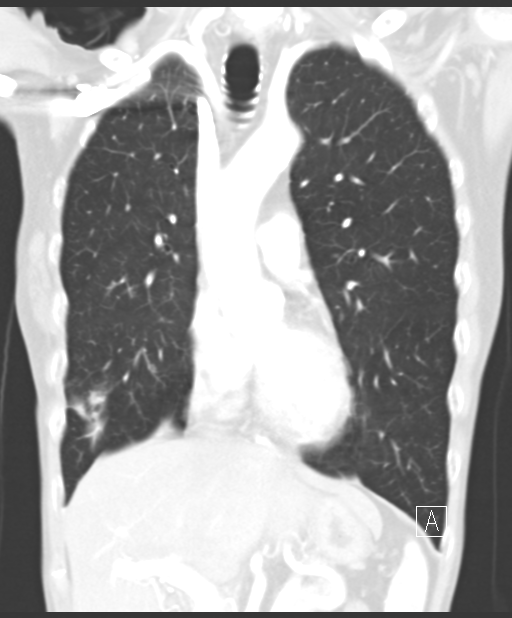
[im 24/40  lung]
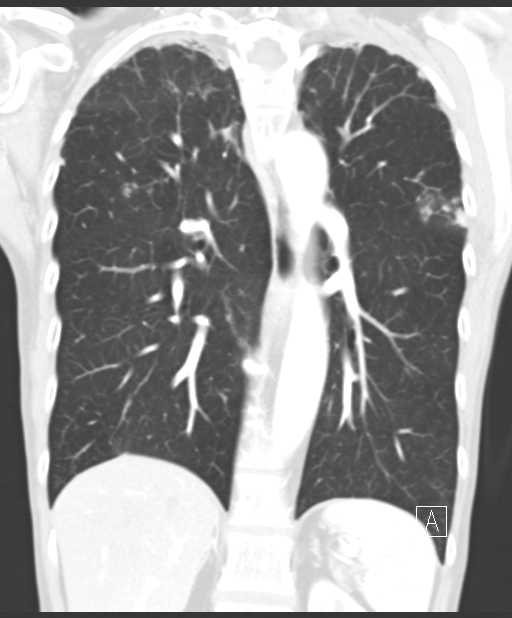

[15 of 36 positions shown; findings below may reference images not displayed]

REASON FOR EXAM

*
Lung nodule, COPD, shortness of breath with left-sided pressure

COMPARISON

*
Chest radiographs 08/05/2019

TECHNIQUE

*
CT images of the chest were acquired before and after the administration of 100 mL Usovue-RUU IV contrast

*
i-STAT Creatinine 0.7 mg/dL

*
Total radiation dose to patient is CTDIvol 6.75 mGy and DLP 222.99 mGy-cm.

FINDINGS

Mediastinum and Thoracic Inlet

*
Normal size heart

*
Physiologic amount of pericardial fluid

*
No lymphadenopathy

Lungs and Airways

*
Clustered tiny centrilobular nodules and cylindrical bronchiectasis are noted in several segments, more conspicuous in the bases

*
A few associated bands of fibrosis without honeycombing

*
No segmental or lobar consolidation

Pleura

*
No effusion

*
Mild thickening best seen in the apices

Chest Wall and Superficial Tissues

*
Dense breasts

*
No acute or healing fracture

*
No destructive osseous lesion

Upper Abdomen

*
Normal

IMPRESSION

*
Residua of atypical infection (mild). CT findings may be due to remote treated disease or indolent, active infection (perhaps nontuberculous mycobacterial or fungal)

## 2020-01-06 ENCOUNTER — Encounter: Attending: Rehabilitative and Restorative Service Providers" | Primary: Internal Medicine

## 2020-01-07 ENCOUNTER — Ambulatory Visit: Attending: Adult Health | Primary: Internal Medicine

## 2020-01-07 ENCOUNTER — Ambulatory Visit
Admit: 2020-01-07 | Discharge: 2020-01-07 | Payer: PRIVATE HEALTH INSURANCE | Attending: Rehabilitative and Restorative Service Providers" | Primary: Internal Medicine

## 2020-01-07 DIAGNOSIS — M25552 Pain in left hip: Secondary | ICD-10-CM

## 2020-01-07 NOTE — Progress Notes (Signed)
[facility]   [address]  [place] [address]  [phone]   Physical Therapy Initial Assessment      Referring MD: Haze Rushing cook, NP   Diagnosis:     ICD-10-CM ICD-9-CM    1. Left hip pain  M25.552 719.45    2. Muscle weakness  M62.81 728.87    3. Posture abnormality  R29.3 781.92    4. Impaired functional mobility, balance, gait, and endurance  Z74.09 V49.89    5. Chronic low back pain, unspecified back pain laterality, unspecified whether sciatica present  M54.50 724.2     G89.29 338.29       Surgery: n/a   Therapy precautions:None  Co-morbidities affecting plan of care: 45 lumbar scoliosis  Total Timed Codes: 5 min, Total Treatment Time: 30 min    Patient History    Past medical and surgical history:   Past Medical History:   Diagnosis Date   . Arthritis    . Dyslipidemia    . MRSA infection     x2 on leg last episode 4 years ago     Past Surgical History:   Procedure Laterality Date   . HX ORTHOPAEDIC      left ankle   . PR ABDOMEN SURGERY PROC UNLISTED      chole   . PR ANESTH,SURGERY OF SHOULDER Right     arthroscopy     Medications: reviewed in chart   Allergies: No Known Allergies check with patient, record shows medication allergies from [facility]    Chief complaints: Pain L IT band, trochanter and L buttock, intermittent central LBP, denies weakness/numbness/tingling, both legs cramp at night    History of injury: 6 week history of L lateral hip pain- started at IT band and then moved towards L buttock. Pt reports that she was assisting her mother around that time and went up/down steps frequently but had decreased activity overall as she was not attending pilates or exercising.  Pt started a magnesium supplement to address cramping but has not noted a significant change    Diagnostic exams (per chart review): x-rays L hip and back: no significant findings per patient report except scoliosis increased ~5 degrees    Pain Assessment:   . Pain location: central  LB, L buttock, L greater trochanter, L IT band    . Current pain (0-10 pain scale): 0/10  . Pain at best: (0-10 pain scale): 0/10  . Pain at worst: (0-10 pain scale): 7/10  . Pain pattern: constant if up/moving,   . Description: sharp and stabbing  . Aggravating factors: prolonged standing, walking and stairs, pilates, squats, lying on L side  . Alleviating factors: sitting and rest    Social/Functional Hx:  How would you rate your overall health? very good  Pt lives with independent spouse in a(n) 2+ story house and bedroom on 2nd floor with entry steps.   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   Sleep: severely disturbed due to leg cramping and LLE pain, cannot lie on the L side  PLOF & Social Hx/Interests: Independent and active without physical limitations and participates in tennis, pilates Financial planner), bike riding, walking   Current level of function: difficulty with walking, stairs, shopping- anything that requires extended standing/walking, modified pilates, unable to play tennis/walk for exercise    Patient Stated Goals: decrease pain, return to full activity  Examination  Functional Outcome Questionnaire: Hip Outcome Score: 38/64 = 59% Function  Observation:   Posture: stands with narrow base of support, elevated L shoulder/scapula, elevated L iliac crest, lumbar scoliosis (R convex)   Swelling/Edema: none  Skin Integrity: normal   Palpation/joint mobility: stiffness throughout thoracic and lumbar spine with central P-As, increased muscle tone/tenderness L piriformis, L IT band, B lumbar paraspinals, tenderness L greater trochanter    A/PROM Measures:     lumbar  01/06/20   Comments   Flexion Decreased 50% L thoracic and R lumbar prominence   Extension WFL    R side bend WFL    L side bend WFL    R rotation WFL    L rotation WFL    Hip ROM Decreased B hip extension      Strength/MMT (0-5 Scale):     Right Left Comment   Hip Flexion 4 4 Increased pain L lateral hip    Hip Extension 3+ 3+    Hip Abduction 4 3+    Hip ER 4+ 4    Hip IR 4+ 4 Increased pain L lateral hip   Knee Extension 5 5    Knee Flexion 5 5    Ankle DF 5 5    Ankle PF 5 5    Lower Abdominals   Double leg lowering to 55 from surface = 3+/5           Special Tests/Function:   Gait: antalgic L, independent without device  Stair management:reciprocal ascending/descending, unilateral handrail, quick step on L foot during ascent with complaint of pain  Sit to stand: narrow base of support, minimal UE support from standard chair height  Balance: single leg stance 30 sec B  Special tests: Heel walk: 10 steps independently  Toe walk: 10 steps independently  Standing forward bend: discomfort low back, tightness hamstrings*  Hamstring flexibility: marked restriction B  Ely's: moderate restriction B  SLR: negative B  FABER: (+) L for lateral hip pain  SI gap: negative  SI compression: negative  Ober's: positive L w/ moderate restriction    Treatment provided today consisted of initial evaluation followed by:  Therapeutic exercise (97110) x 5 min to address ROM/strength deficits and to develop an initial HEP as noted below.    Clinical Decision Making/Assessment     Personal Factors/co-morbidities affecting POC (1-2 Medium/3+High): age  co-morbidities as previously noted   Problem List: (1-2 Low/ 3 Medium/ 4+ High) Pain  ROM limitations  Soft tissue restrictions  Strength deficits  Muscle spasm  Impaired posture  Impaired gait  Restricted social activity  Restricted recreational participation  Difficulty sleeping    Clinical decision making: moderate complexity with questionable prediction of expectations and future outcomes which may require adjustments to the POC.  Prognosis: good   Benefits and precautions of treatment explained to patient.  Jaza Onsurez is a 64 y.o. female who presents to therapy today with stable clinical presentation (low complexity) related to L hip pain and central LBP with history of scoliosis.  Pt would benefit from skilled physical therapy services to address the deficits noted above for return to prior level of function.    Plan of Care    Effective Dates: 01/07/2020 TO 02/18/2020.    Frequency/Duration: 2x/week for 42 Day(s)  Interventions  may include but are not limited to: (16109) Therapeutic exercise to develop ROM, strength, endurance and flexibility  (97530) Therapeutic activities using dynamic activities to improve function  (97140) Manual therapy techniques to improve joint and/or soft tissue mobility, ROM, and function as well as helping  to decrease pain/spasms and swelling  (35573) Neuromuscular reeducation addressing impaired balance, coordination, kinesthetic sense, posture and proprioception  (20560/20561) Dry needling for the management of neuromusculoskeletal pain and movement impairment  Home exercise program (HEP) development       Goals:  Short term goals to be met by 01/28/2020 (3 weeks):  1. Patient will demonstrate good recall of HEP requiring no more than minimal verbal cuing for proper form and technique.  2. Pt will increase lower abdominal strength to at least 4/5 for improved spinal stabilization with functional activity.  3. Pt will report ability to walk 10 min or greater with minimal difficulty and </= 3 point increase in pain on 0-10 pain scale.     Long term goals to be met by 02/18/2020  (6 weeks):  1. Patient will be compliant and independent with a comprehensive HEP and activity progression.  2. Pt will increase lower abdominal strength to at least  4+/5 for improved spinal stabilization with functional activity.  3. Pt will report ability to walk 20 min or greater with minimal difficulty and </= 3 point increase in pain on 0-10 pain scale.   4. Pt will climb a full flight of stairs with symmetrical pattern and </= 3 point increase on 0-10 pain scale.  5. Pt will increase strength in B hips with MMT to at least 4+/5 for improved stability with squatting and improved  ability to climb stairs.  6. Pt will improve score on the Hip Outcome Survey to at least 75% function.    Med[employer] Portal   Access Code: [email or web address]  URL: [email or web address]  Date: 01/07/2020  Prepared by: Youlanda Roys    Program Notes  Youlanda Roys, PT, DPT  [phone]  [email or web address]      Exercises  Supine Hamstring Stretch with Strap - 2 x daily - 1 sets - 4 reps - 30 sec hold  Supine ITB Stretch with Strap - 2 x daily - 1 sets - 4 reps - 30 hold  Modified Thomas Stretch - 2 x daily - 1 sets - 4 reps - 30 hold  Supine Transversus Abdominis Bracing - Hands on Stomach - 2 x daily - 30 reps - 1 sets - 5 hold  Supine Transversus Abdominis Bracing - Hands on Ground - 2 x daily - 10 reps - 2 sets - 5 hold

## 2020-01-12 ENCOUNTER — Encounter: Attending: Rehabilitative and Restorative Service Providers" | Primary: Internal Medicine

## 2020-01-13 NOTE — Progress Notes (Signed)
Pt cancelled < 24 hours for their scheduled therapy appointment 01/12/20 and also cancelled 01/15/20.    Reason: unexpectedly had to go out of town for family issue

## 2020-01-15 ENCOUNTER — Encounter: Attending: Rehabilitative and Restorative Service Providers" | Primary: Internal Medicine

## 2020-01-19 ENCOUNTER — Encounter: Attending: Rehabilitative and Restorative Service Providers" | Primary: Internal Medicine

## 2020-04-06 NOTE — Progress Notes (Signed)
[facility]   [address]  [place] [address]  [phone]  Outpatient Physical Therapy Discharge/Discontinuation Note     Referring MD: Dr. Kasandra Knudsen  Date of Initial Evaluation: 01/07/20   Number of visits: 1  Number of cancellations: 1  Number of no shows: 0    Patient has discontinued therapy based on no return.    Patient was  last seen for PT services on 01/07/2020.  Pt was seen for initial treatment session only. Patient demonstrated good recall of HEP.  Please see previous notes for objective findings. Pt cancelled appointment due to going out of town unexpectedly and has not contacted office to reschedule.

## 2020-05-17 IMAGING — CR CHEST 2 VWS PA LAT
1 series · 2 of 2 positions shown · non-contrast
Comparison: 08/05/2019.

HISTORY: 64-year-old female with acute bronchitis.
TECHNIQUE: 2 view radiograph of the chest.

[Series 1: pa · 0.17mm/px · 2 of 2 slices shown]
[im 1/2]
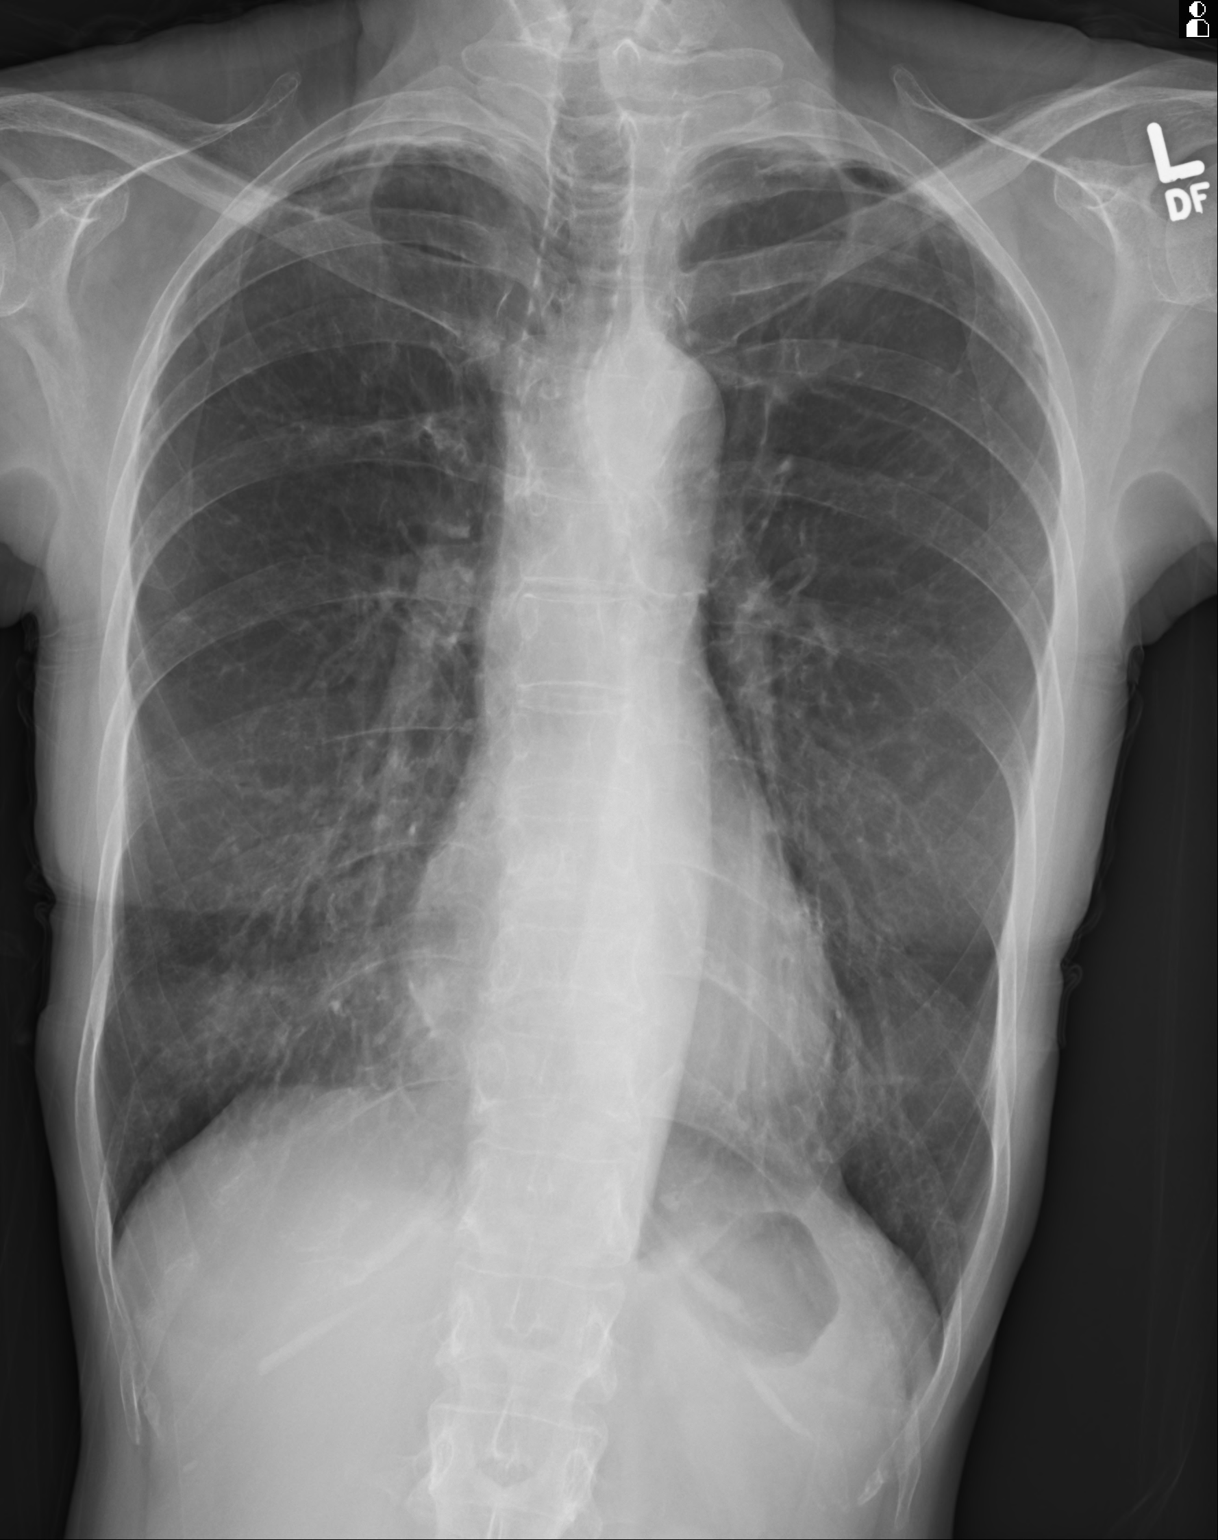
[im 2/2]
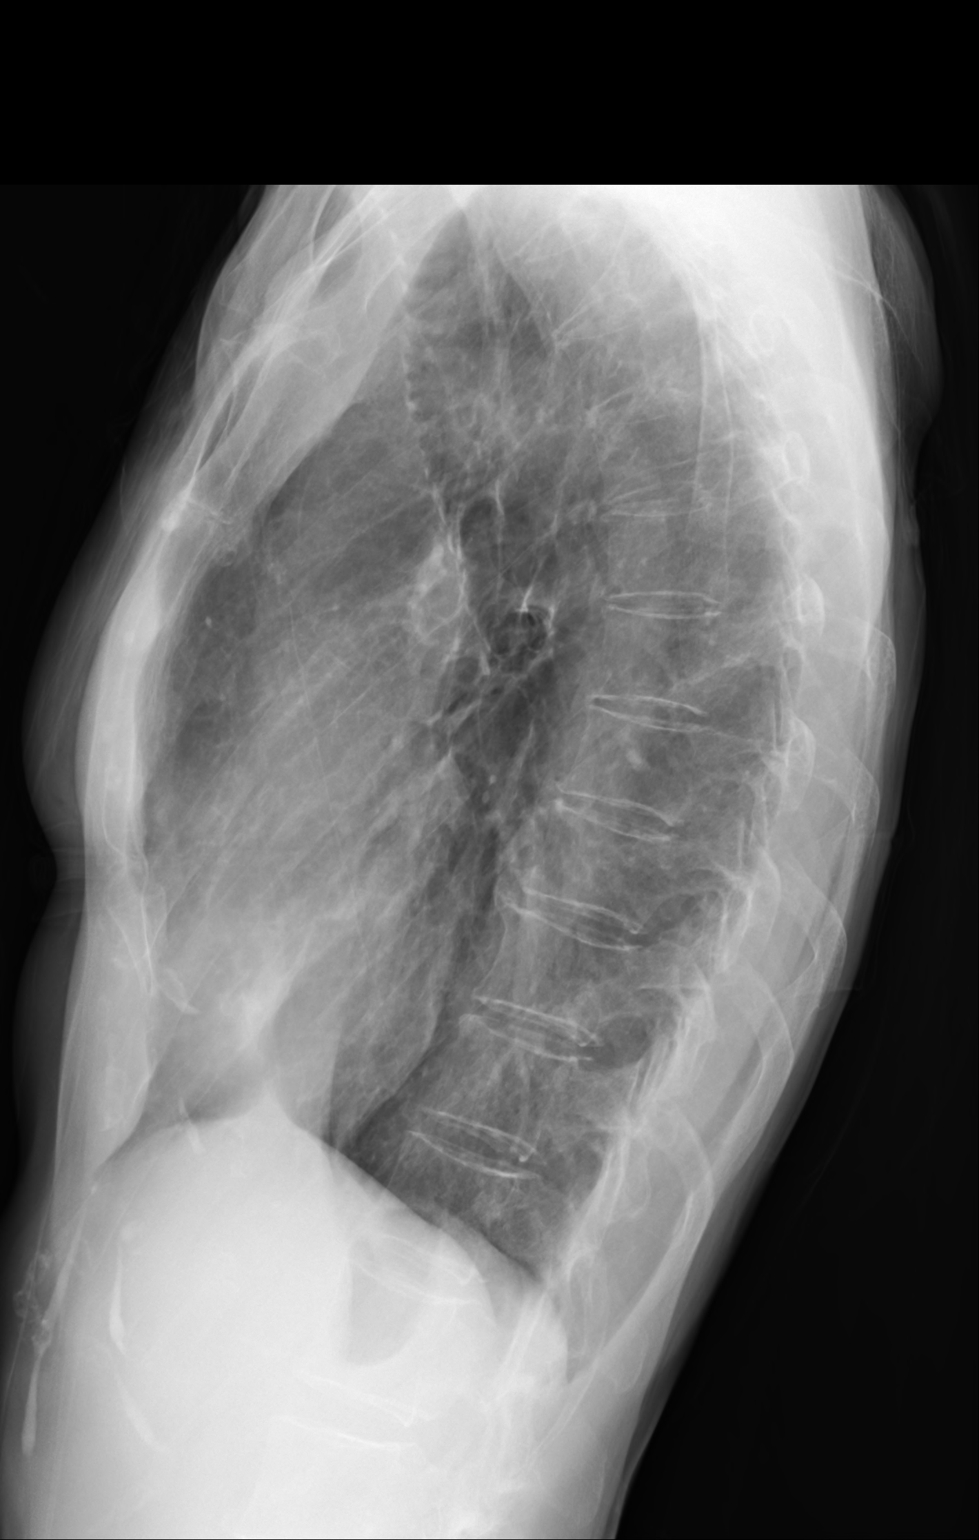

[2 of 2 positions shown; findings below may reference images not displayed]

FINDINGS: Hyperinflation of the lungs with emphysematous changes in the upper lobes. No pulmonary consolidations. The cardiomediastinal silhouette is within normal limits. No pleural effusions or pneumothorax. No acute osseous abnormalities.
IMPRESSION: Emphysematous changes. No pulmonary consolidations.

## 2020-09-29 IMAGING — US US RENAL RETRO COMPLETE
1 series · 13 of 25 positions shown · non-contrast
Comparison: None.

HISTORY: 65-year-old female with other retention of urine.
TECHNIQUE: Gray-scale and color Doppler imaging of the kidneys was performed.

[Series 1: us renal retro complete · 13 of 51 slices shown]
[im 1/51]
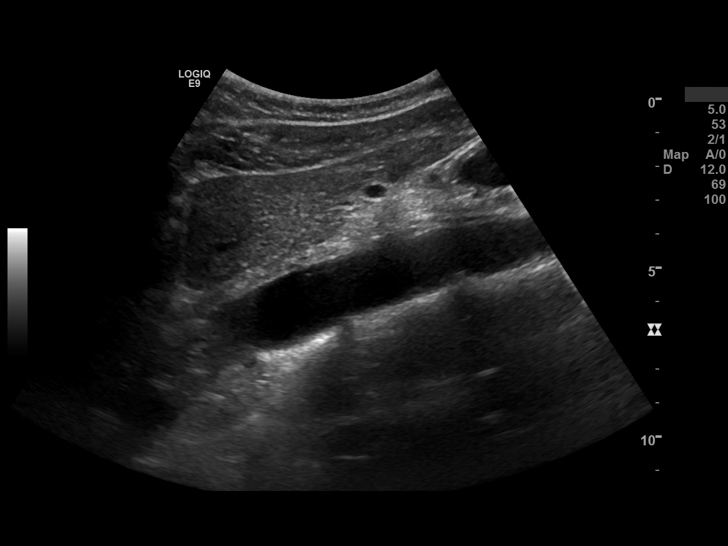
[im 5/51]
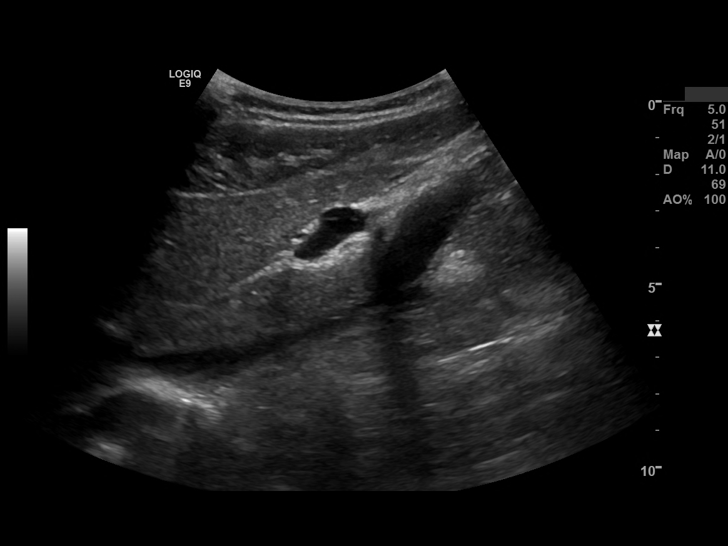
[im 9/51]
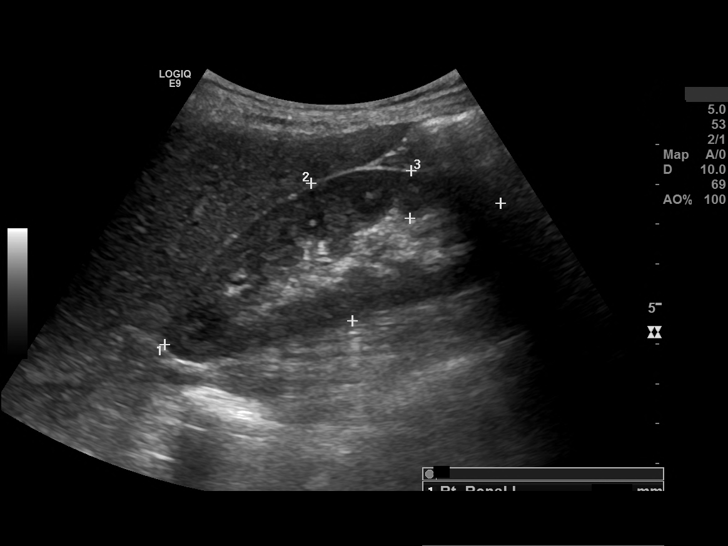
[im 13/51]
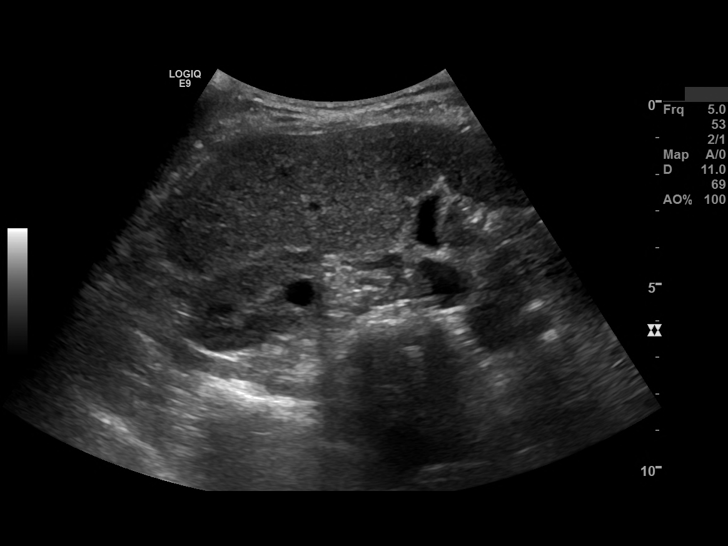
[im 17/51]
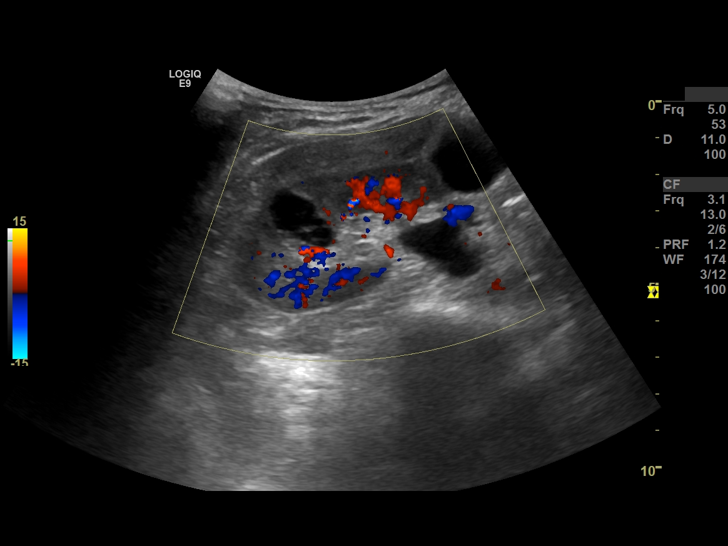
[im 21/51]
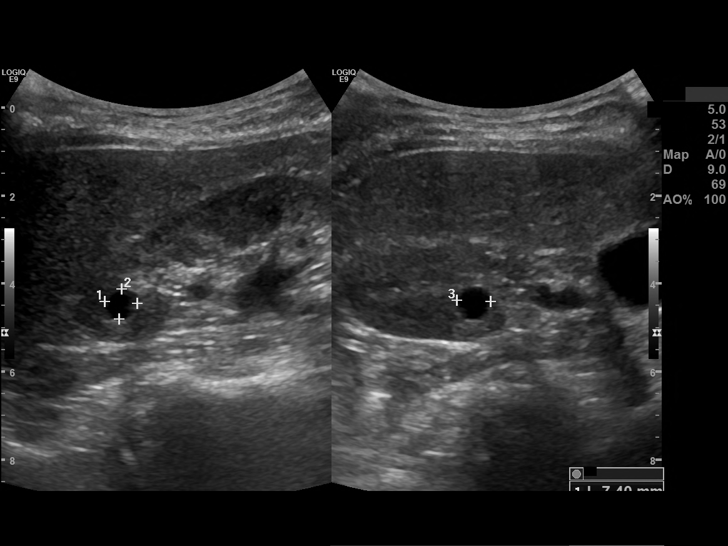
[im 26/51]
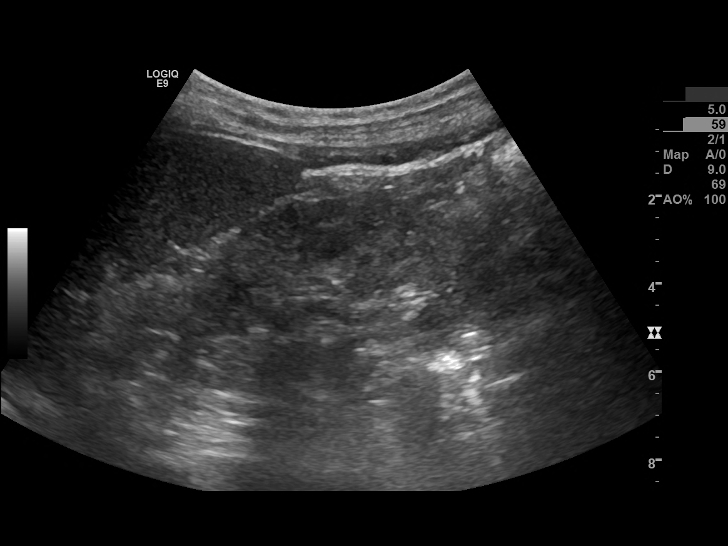
[im 30/51]
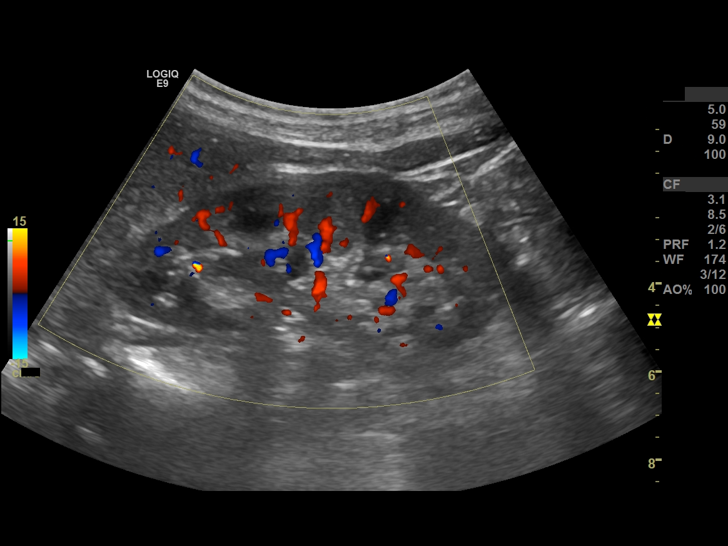
[im 34/51]
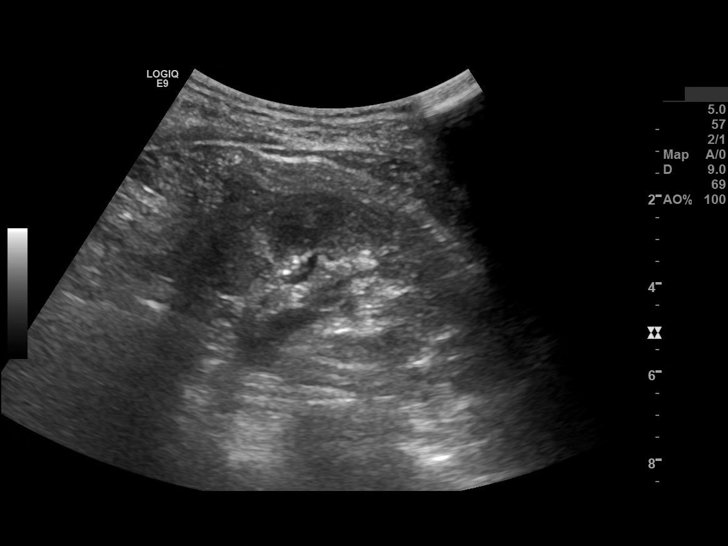
[im 38/51]
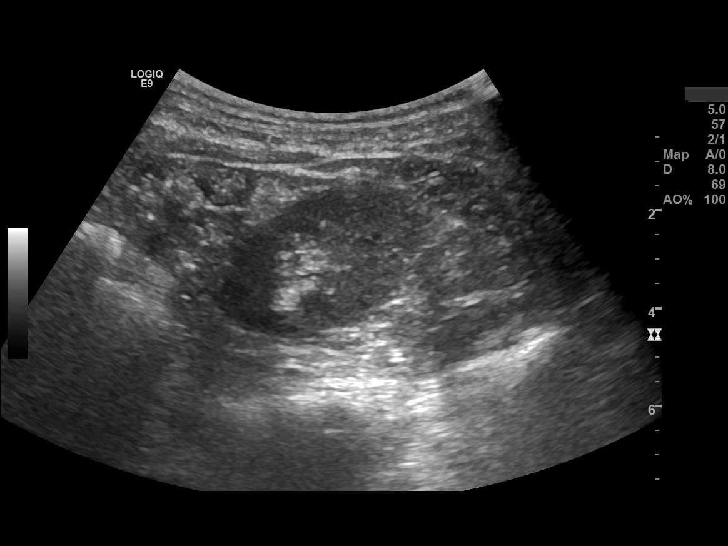
[im 42/51]
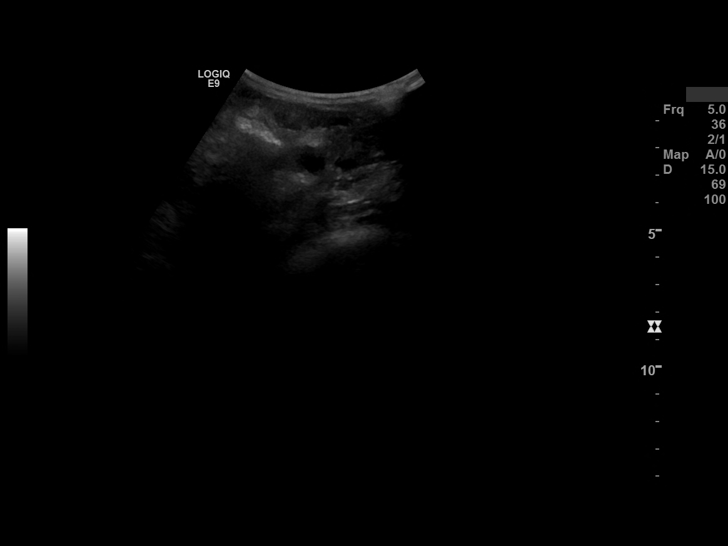
[im 46/51]
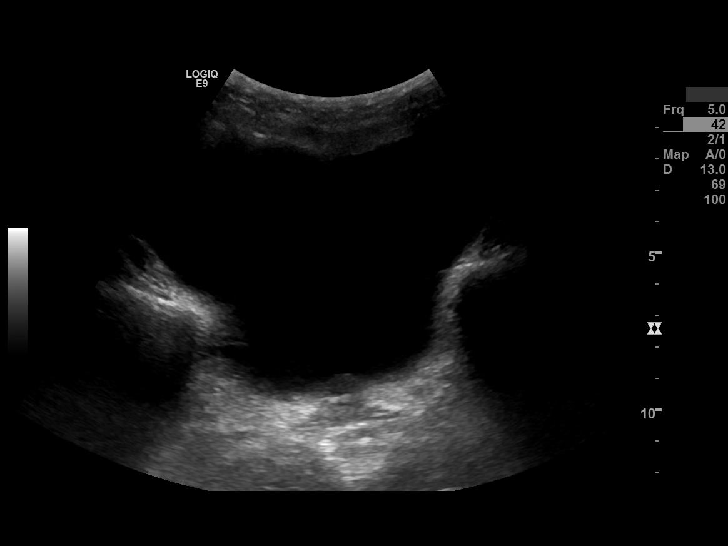
[im 51/51]
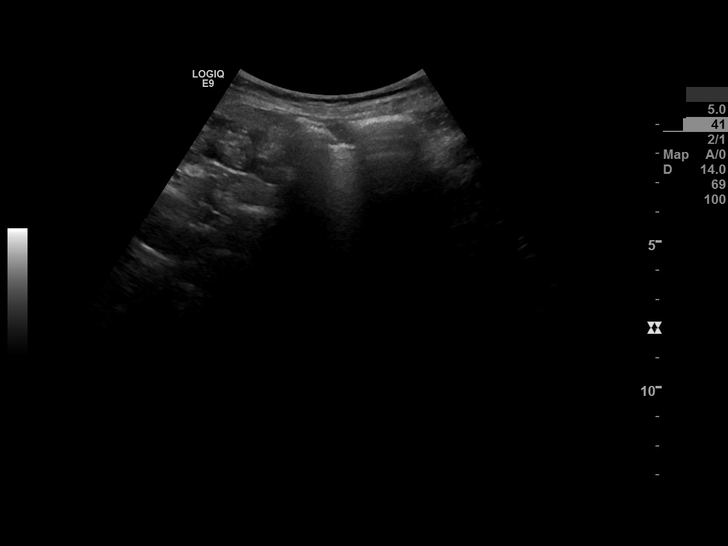

[13 of 25 positions shown; findings below may reference images not displayed]

FINDINGS: Image quality: Satisfactory.

RIGHT KIDNEY:

*
Size: Normal, measuring 91 mm in length.

*
Cortical Echogenicity: Normal.

*
Parenchymal Thickness: Thinned.

*
Stones: None.

*
Hydronephrosis: None.

*
There is a 14 x 16 x 16 mm complex predominantly cystic lesion is noted in the interpolar region of the right kidney, which is not fully evaluated. Simple appearing cyst is noted in the upper pole.

LEFT KIDNEY:

*
Size: Mildly small, measuring 88 mm in length.

*
Cortical Echogenicity: Normal.

*
Parenchymal Thickness: Thinned.

*
Stones: None.

*
Hydronephrosis: None.

URINARY BLADDER: 

Normal for degree of distention.

VASCULATURE:

*
Aorta: Visualized portions are unremarkable.

*
Proximal Iliac Arteries: Visualized portions are unremarkable.

*
IVC: Visualized portions are unremarkable.
IMPRESSION: 1.
16 mm complex predominantly cystic lesion in the interpolar region of the right kidney is not fully evaluated. Consider obtaining MRI of the abdomen without and with contrast to evaluate for suspicious features.

2.
The appearance of the kidneys can be seen with medical renal disease. No hydronephrosis.

## 2020-10-25 ENCOUNTER — Ambulatory Visit
Admit: 2020-10-25 | Discharge: 2020-10-25 | Payer: PRIVATE HEALTH INSURANCE | Attending: Surgical | Primary: Internal Medicine

## 2020-10-25 ENCOUNTER — Ambulatory Visit: Admit: 2020-10-25 | Discharge: 2020-10-25 | Payer: PRIVATE HEALTH INSURANCE | Primary: Internal Medicine

## 2020-10-25 DIAGNOSIS — M545 Low back pain, unspecified: Secondary | ICD-10-CM

## 2020-10-25 MED ORDER — GABAPENTIN 100 MG PO CAPS
100 MG | ORAL_CAPSULE | Freq: Three times a day (TID) | ORAL | 0 refills | Status: AC
Start: 2020-10-25 — End: 2020-11-24

## 2020-10-25 MED ORDER — TRAMADOL HCL 50 MG PO TABS
50 MG | ORAL_TABLET | Freq: Four times a day (QID) | ORAL | 0 refills | Status: AC | PRN
Start: 2020-10-25 — End: 2020-11-01

## 2020-10-25 NOTE — Progress Notes (Signed)
Name: Monique Barker  Date of Birth: 06/28/1955  Gender: female  MRN: [id number]    CC: Back Pain (Left buttock pain and left hip pain down the left leg)       HPI: This is a 65 y.o. year old female who I have seen in the past for lower back pain and pain radiating into her hip or chronic bursitis.  She had a mild lumbar scoliosis at that time.  We referred her to physical therapy with Melissa Powers.  She has been in physical therapy intermittently multiple times over the past several years.  She continues to see Melissa.  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  The symptoms are much worse with standing or walking.  She has not been able to play tennis anymore and really has a hard time walking for exercise with friends.  She does have pain at night.  She has had Voltaren, gabapentin and over the past 6 months has had over 20 visits with physical therapy.    History was obtained by patient    The patient denies any change in bowel or bladder function since the onset of the symptoms. she  has not had lumbar surgery in the past.    She also tells me she saw Rachael FeeJoyce Cook with [facility] and MRI scan of the thoracic and lumbar spine were obtained.  She did see Dr. Jessy OtoLinehan.  Recommended to return if she ever wanted to discuss surgical intervention.  I do not have the images from the MRI however this was over 1 year ago.     AMB PAIN ASSESSMENT 10/25/2020   Location of Pain Buttocks   Location Modifiers Left   Severity of Pain 8   Quality of Pain Aching;Dull   Duration of Pain Persistent   Frequency of Pain Constant   Date Pain First Started 03/07/2020   Aggravating Factors Standing;Walking   Limiting Behavior Yes   Relieving Factors Other (Comment)   Result of Injury No   Work-Related Injury No    Are there other pain locations you wish to document? -            ROS/Meds/PSH/PMH/FH/SH: I personally reviewed the patient's collected intake data.  Below are the pertinents:    Allergies   Allergen Reactions    Pravastatin Myalgia    Methylprednisolone Hives     Patient states this only happened once with a taper.  Taken prior stable doses for several days with no reaction. No prior reactions with steroid injections in the past.         Current Outpatient Medications:     gabapentin (NEURONTIN) 300 MG capsule, Take 300 mg by mouth in the morning and 300 mg at noon and 300 mg before bedtime., Disp: , Rfl:     Coenzyme Q10 100 MG TABS, Take by mouth, Disp: , Rfl:     Estradiol (VAGIFEM) 10 MCG TABS vaginal tablet, Insert one tablet vaginally twice weekly at night., Disp: , Rfl:     rosuvastatin (CRESTOR) 5 MG tablet, Take 5 mg by mouth, Disp: , Rfl:     gabapentin (NEURONTIN) 100 MG capsule, Take 1 capsule by mouth in the morning and 1 capsule at noon and 1 capsule before bedtime. Do  all this for 30 days., Disp: 90 capsule, Rfl: 0    traMADol (ULTRAM) 50 MG tablet, Take 1 tablet by mouth every 6 hours as needed for Pain for up to 7 days. Intended supply: 7 days. Take lowest dose possible to manage pain, Disp: 28 tablet, Rfl: 0    CALCIUM PO, Take by mouth, Disp: , Rfl:     CRANBERRY PO, Take by mouth, Disp: , Rfl:     ZINC PO, Take by mouth, Disp: , Rfl:     Cholecalciferol 50 MCG (2000 UT) TABS, Take by mouth daily, Disp: , Rfl:     diclofenac (VOLTAREN) 75 MG EC tablet, Take by mouth, Disp: , Rfl:     fexofenadine (ALLEGRA) 180 MG tablet, Take by mouth, Disp: , Rfl:     montelukast (SINGULAIR) 10 MG tablet, Take 10 mg by mouth, Disp: , Rfl:     omeprazole (PRILOSEC) 20 MG delayed release capsule, Take 20 mg by mouth daily, Disp: , Rfl:     TURMERIC PO, Take by mouth (Patient not taking: Reported on 10/25/2020), Disp: , Rfl:     ascorbic acid (VITAMIN C) 500 MG tablet, Take by mouth daily (Patient not taking:  Reported on 10/25/2020), Disp: , Rfl:     aspirin 81 MG EC tablet, Take 81 mg by mouth daily (Patient not taking: Reported on 10/25/2020), Disp: , Rfl:     hydroCHLOROthiazide (HYDRODIURIL) 12.5 MG tablet, Take 12.5 mg by mouth daily (Patient not taking: Reported on 10/25/2020), Disp: , Rfl:     Past Surgical History:   Procedure Laterality Date    ANESTH,SURGERY OF SHOULDER Right     arthroscopy    ORTHOPEDIC SURGERY      left ankle    PR ABDOMEN SURGERY PROC UNLISTED      chole       There is no problem list on file for this patient.        Tobacco:  reports that she has never smoked. She has never used smokeless tobacco.  Alcohol:   Social History     Substance and Sexual Activity   Alcohol Use No        Physical Exam:   BMI: Body mass index is 25.13 kg/m??.    GENERAL:  Adult in no acute distress, well developed, well nourished Patient is appropriately conversant  MSK:  Examination of the lumbar spine reveals scoliosis    There is no tenderness to palpation along the spinous processes and paraspinal musculature.   The patient ambulates with a normal gait.    ROM of bilateral hip(s) reveals no irritability.   NEURO:  Cranial nerves grossly intact.  No motor deficits.    Straight leg testing is positive left  Sensory testing reveals intact sensation to light touch and in the distribution of the L3-S1 dermatomes bilaterally  Ankle jerk is negative for clonus.  There is some atrophy of the left calf    Reflexes   Right Left   Quadriceps (L4) 2 2   Achilles (S1) 2 2     Strength testing in the lower extremity reveals the following based on the 5 point grading scale:     HF (L2) H Ab (L5) KE (L3/4) ADF (L4) EHL (L5) A Ev (S1) APF (S1)   Right Left PSYCH:  Alert and oriented X 3.  Appropriate affect.  Intact  judgment and insight.         Radiographic Studies:     AP, lateral and spot views of the lumbar spine:  10/25/20  AP of the lumbar spine reveals dextroscoliosis the back and lumbar  junction there is some rightward tilting of the L3-4 disc and lateral listhesis.  The curvature has progressed since her last x-rays and 2016.  I did review reveals multilevel degenerative changes and facet arthropathy.  No anterior listhesis is appreciated.  L5-S1 disc base actually looks fairly well-maintained.    X-ray impression: Lumbar scoliosis.      MRI Lumbar Spine without Contrast      Impression  Performed by [facility]  1.  Severe scoliosis with extensive degenerative change.   2.  As above.     Signed by: 06/22/2019 9:26 AM: Clemon Chambers    Narrative  Performed by [facility]    EXAM:  MRI Lumbar spine     COMPARISON:  X-ray     INDICATION:  M54.5 Low back pain I10;G89.29 Other chronic pain I10;;Worsening back pain in a patient with prominent scoliosis of the thoracolumbar region centered around L1.  Concerned about spinal stenosis.     TECHNICAL: Sagittal T1, sagittal T2, sagittal STIR, axial T1, and axial T2 sequences of the lumbar spine performed. Additional coronal T2 for scoliosis     FINDINGS:   Osseous structures:   Marked scoliosis is present as seen on x-ray. No marrow edema or other structural abnormalities seen.   Conus tip terminates at L1.   A T2 hyperintense area within the right kidney is likely a cyst.     T12-L1: Disc bulge is present with facet hypertrophy. Moderate foraminal narrowing is seen on the left.   L1-2:  Marked facet hypertrophy is present. Disc bulge is noted. There is marked foraminal narrowing on the left.   L2-3:  Marked facet hypertrophy is present. There is a small disc bulge with moderate foraminal narrowing bilaterally. Mild central stenosis is noted.   L3-4:  Marked facet hypertrophy is present. Circumferential disc bulge is present asymmetric to the right side. There is marked foraminal narrowing on the right and milder changes on the left. Moderate central stenosis is noted.   L4-5:  Marked facet hypertrophy is present. Circumferential disc bulge is noted asymmetric to  the right side. This abuts the L4 nerve root on the right. Moderate foraminal narrowing seen bilaterally.   L5-S1:  Small central distribution is present without lateralized findings.Marked facet hypertrophy is noted.      MRI Thoracic Spine without Contrast      Impression  Performed by [facility]  1.  Widespread thoracic spondylosis is noted with multiple sites of potential clinically significant neural impingement noted as described in detail above. In addition mild dilatation of the central thoracic spinal cord canal noted. The site or sites of greatest apparent neural impingement are reiterated below.   2.  At T6-7 and T7-8 disc protrusions abut and compress the thoracic spinal cord.   3. At T10-11 right-sided central canal and foraminal narrowing noted.   4. At T12-L1 and L1-2 left foraminal narrowing is noted.     Signed by: 06/22/2019 8:21 AM: Barry Dienes    Narrative  Performed by [facility]    EXAM:  MRI thoracic spine     COMPARISON:  Thoracic spine radiographs 03/10/2019     INDICATION:  M41.9 Scoliosis, unspecified I10;M54.5 Low back pain I10;M54.6 Pain in thoracic spine I10;M54.5 Low back pain I10;G89.29 Other chronic  pain I10;;Worsening back pain in a patient with prominent scoliosis of the thoracolumbar spine centered around L1   Scoliosis of thoracolumbar spine; chronic intermittent back       TECHNICAL: Sagittal T1, sagittal T2, sagittal STIR, axial T1, and axial T2 sequences of the thoracic spine performed.     FINDINGS:   Osseous structures:   Vertebral morphology and alignment demonstrate no evidence of fracture infection or tumor. Dextroscoliosis of the lower thoracic and lumbar spine incompletely visualized.   Mild dilatation of the central canal the thoracic spinal cord is seen from T6-7 down to T11 with a maximum diameter of approximately 1.3 mm at the T7 level.   No signal abnormality in the visualized mediastinum nor retroperitoneum. Paraspinous musculature is unremarkable.     At T6-7, a  right posterior paracentral disc protrusion is present with cardiomegaly migrated disc protrusion abutting the ventral aspect of the thoracic spinal cord with mild deformity at the mid to upper half of the T7 vertebral body.     At T7-8, a right posterior paracentral disc protrusion abuts and compresses the ventral aspect of the thoracic spinal cord and demonstrates extruded caudally migrated disc material along the anterior aspect of the spinal cord extending down to the mid body of T8.     At T10-11, right posterior paracentral disc osteophyte complex causes right-sided mild central canal and right foraminal narrowing. Mild bilateral facet arthropathy is noted.     At T12-L1, disc osteophyte complex causes mild-to-moderate left foraminal narrowing. Bilateral facet arthropathy is present.     At L1-2, severe left intervertebral foraminal narrowing appears to be present with osteophyte formation from vertebral endplates and facet and left greaterthan right-sided facet arthropathy noted.    Assessment/Plan:         Diagnosis Orders   1. Low back pain, unspecified back pain laterality, unspecified chronicity, unspecified whether sciatica present  XR LUMBAR SPINE (2-3 VIEWS)    traMADol (ULTRAM) 50 MG tablet    MRI LUMBAR SPINE WO CONTRAST      2. Scoliosis, or kyphoscoliosis, idiopathic  MRI LUMBAR SPINE WO CONTRAST      3. Lumbar radiculopathy  MRI LUMBAR SPINE WO CONTRAST            This patient's clinical history and physical exam is consistent with a left  L-3 and L-4 lumbar radiculopathy. We discussed the natural history of lumbar radiculopathy in that many of these patients have near complete resolution of their symptoms within eight to twelve weeks with conservative care. We discussed that conservative treatments typically start with activity modification, and medication followed by physical therapy as symptoms allow.  Oral and/or epidural steroids are other options. I also discussed potential surgical options  if the symptoms fail to improve or there is a progressive neurologic deficit and conservative management has been exhausted.  We discussed that surgery is not typically a reliable treatment for isolated back pain, but is usually very reliable in relieving buttock and leg symptoms.          - A MRI was ordered to delineate anatomy, confirm the diagnosis and assess the severity.    We would like to delineate neurogenic compression and hopefully order lumbar injections to help with her symptoms.    We also discussed the role of gabapentin.  She feels the 300 mg is too much during the day we will prescribe 100 mg to titrate up to 1 in the morning 1 at lunch and even up to 200  mg if needed.    Also may consider adding a lumbar scoliosis brace.    4 This is a chronic illness/condition with exacerbation and progression    Orders Placed This Encounter   Medications    gabapentin (NEURONTIN) 100 MG capsule     Sig: Take 1 capsule by mouth in the morning and 1 capsule at noon and 1 capsule before bedtime. Do all this for 30 days.     Dispense:  90 capsule     Refill:  0    traMADol (ULTRAM) 50 MG tablet     Sig: Take 1 tablet by mouth every 6 hours as needed for Pain for up to 7 days. Intended supply: 7 days. Take lowest dose possible to manage pain     Dispense:  28 tablet     Refill:  0     Reduce doses taken as pain becomes manageable        Orders Placed This Encounter   Procedures    XR LUMBAR SPINE (2-3 VIEWS)    MRI LUMBAR SPINE WO CONTRAST            Return for MRI results wtih [facility].     Wilmon Pali, PA-C  10/25/20      Elements of this note were created using speech recognition software.  As such, errors of speech recognition may be present.

## 2020-10-25 NOTE — Progress Notes (Signed)
An MRI of the lumbar spine has been ordered.

## 2020-11-07 LAB — HEMOGLOBIN A1C: Hemoglobin A1C, External: 5.5 % (ref 3.2–7.0)

## 2020-11-08 ENCOUNTER — Ambulatory Visit: Payer: PRIVATE HEALTH INSURANCE | Primary: Internal Medicine

## 2020-11-09 ENCOUNTER — Encounter: Payer: PRIVATE HEALTH INSURANCE | Primary: Internal Medicine

## 2020-11-09 ENCOUNTER — Ambulatory Visit
Admit: 2020-11-09 | Discharge: 2020-11-09 | Payer: PRIVATE HEALTH INSURANCE | Attending: Surgical | Primary: Internal Medicine

## 2020-11-09 ENCOUNTER — Ambulatory Visit: Admit: 2020-11-09 | Discharge: 2020-11-09 | Payer: PRIVATE HEALTH INSURANCE | Primary: Internal Medicine

## 2020-11-09 ENCOUNTER — Encounter: Payer: PRIVATE HEALTH INSURANCE | Attending: Surgical | Primary: Internal Medicine

## 2020-11-09 DIAGNOSIS — M4120 Other idiopathic scoliosis, site unspecified: Secondary | ICD-10-CM

## 2020-11-09 NOTE — Progress Notes (Signed)
Name: Jeanenne Licea  Date of Birth: 1955-06-21  Gender: female  MRN: [id number]    CC: Back Pain (MRI results)       HPI: This is a 65 y.o. year old female who I have seen in the past for lower back pain and pain radiating into her hip or chronic bursitis.  She had a mild lumbar scoliosis at that time.  We referred her to physical therapy with Melissa Powers.  She has been in physical therapy intermittently multiple times over the past several years.  She continues to see Melissa.  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  The symptoms are much worse with standing or walking.  She has not been able to play tennis anymore and really has a hard time walking for exercise with friends.  She does have pain at night.  She has had Voltaren, gabapentin and over the past 6 months has had over 20 visits with physical therapy.    The patient denies any change in bowel or bladder function since the onset of the symptoms. she  has not had lumbar surgery in the past.    She also tells me she saw Rachael Fee with [facility] and MRI scan of the thoracic and lumbar spine were obtained.  She did see Dr. Merleen Nicely.  Recommended to return if she ever wanted to discuss surgical intervention.  I do not have the images from the MRI however this was over 1 year ago.    We recommended a new MRI scan of the lumbar spine.  I did note on her lumbar x-rays that Her dextroscoliosis has significantly advanced over the past 5 years with apex being at L1-2 and L2-3 on the left which I felt was likely the source of her left radicular symptoms.     AMB PAIN ASSESSMENT 10/25/2020   Location of Pain Buttocks   Location Modifiers Left   Severity of Pain 8   Quality of Pain Aching;Dull   Duration of Pain Persistent   Frequency of  Pain Constant   Date Pain First Started 03/07/2020   Aggravating Factors Standing;Walking   Limiting Behavior Yes   Relieving Factors Other (Comment)   Result of Injury No   Work-Related Injury No   Are there other pain locations you wish to document? -            ROS/Meds/PSH/PMH/FH/SH: I personally reviewed the patient's collected intake data.  Below are the pertinents:    Allergies   Allergen Reactions    Pravastatin Myalgia    Methylprednisolone Hives     Patient states this only happened once with a taper.  Taken prior stable doses for several days with no reaction. No prior reactions with steroid injections in the past.         Current Outpatient Medications:     gabapentin (NEURONTIN) 300 MG capsule, Take 300 mg by mouth in the morning and 300 mg at noon and 300 mg before bedtime., Disp: , Rfl:     Coenzyme Q10 100 MG TABS, Take by mouth, Disp: , Rfl:     Estradiol (VAGIFEM) 10 MCG TABS vaginal tablet, Insert one tablet vaginally twice weekly at night., Disp: , Rfl:     rosuvastatin (CRESTOR) 5 MG  tablet, Take 5 mg by mouth, Disp: , Rfl:     gabapentin (NEURONTIN) 100 MG capsule, Take 1 capsule by mouth in the morning and 1 capsule at noon and 1 capsule before bedtime. Do all this for 30 days., Disp: 90 capsule, Rfl: 0    CALCIUM PO, Take by mouth, Disp: , Rfl:     CRANBERRY PO, Take by mouth, Disp: , Rfl:     TURMERIC PO, Take by mouth (Patient not taking: Reported on 10/25/2020), Disp: , Rfl:     ZINC PO, Take by mouth, Disp: , Rfl:     ascorbic acid (VITAMIN C) 500 MG tablet, Take by mouth daily (Patient not taking: Reported on 10/25/2020), Disp: , Rfl:     aspirin 81 MG EC tablet, Take 81 mg by mouth daily (Patient not taking: Reported on 10/25/2020), Disp: , Rfl:     Cholecalciferol 50 MCG (2000 UT) TABS, Take by mouth daily, Disp: , Rfl:     diclofenac (VOLTAREN) 75 MG EC tablet, Take by mouth, Disp: , Rfl:     fexofenadine (ALLEGRA) 180 MG tablet, Take by mouth, Disp: , Rfl:     hydroCHLOROthiazide  (HYDRODIURIL) 12.5 MG tablet, Take 12.5 mg by mouth daily (Patient not taking: Reported on 10/25/2020), Disp: , Rfl:     montelukast (SINGULAIR) 10 MG tablet, Take 10 mg by mouth, Disp: , Rfl:     omeprazole (PRILOSEC) 20 MG delayed release capsule, Take 20 mg by mouth daily, Disp: , Rfl:     Past Surgical History:   Procedure Laterality Date    ANESTH,SURGERY OF SHOULDER Right     arthroscopy    ORTHOPEDIC SURGERY      left ankle    PR ABDOMEN SURGERY PROC UNLISTED      chole       There is no problem list on file for this patient.        Tobacco:  reports that she has never smoked. She has never used smokeless tobacco.  Alcohol:   Social History     Substance and Sexual Activity   Alcohol Use No          Radiographic Studies:     MRI Result (most recent):  MRI LUMBAR SPINE WO CONTRAST 11/09/2020    Narrative  History: Low back pain, unspecified; Other idiopathic scoliosis, site  unspecified; Radiculopathy, lumbar region    Exam: MRI lumbar spine without contrast    Technique: Axial and sagittal T1 and T2-weighted sequences are available for  review.  A sagittal STIR sequence is also available for review.    Comparison: 11/08/2014    Findings:    There is S-shaped scoliotic curvature of the included thoracolumbar spine.  Degenerative disc signal present L1-2 through L4-5 with varying degrees of disc  height loss. The conus is normal in configuration and signal intensity  throughout. Axial imaging demonstrates a cyst involving the lower pole the right  kidney measuring 2.6 cm. No additional abnormal retroperitoneal lesions  demonstrated.    L1-2: There is a disc bulge with bilateral facet arthropathy. There is mild left  neural foraminal narrowing.    L2-3: There is a disc bulge with bilateral facet arthropathy. There is mild left  neural foraminal narrowing and mild central stenosis.    L3-L4: There is a disc bulge with bilateral facet arthropathy. No central or  neural foraminal narrowing.    L4-L5: There is a disc  bulge with bilateral facet arthropathy. No central  stenosis.  No neural foraminal narrowing.    L5-S1: There is a disc bulge with bilateral facet arthropathy. There is fluid  within both facets. No central or neural foraminal stenosis.    Impression  Impression:  1. S-shaped scoliotic curvature of the thoracolumbar spine.  2. Multilevel lumbar spondylosis with mild left neural foraminal narrowing at  L1-2 and L2-3. These areas of neural foraminal narrowing are new when compared  with the prior exam. No significant additional interval change.   11/09/20 independently reviewed the MRI of the lumbar spine.  She does have dextroscoliosis with apex of the scoliosis being L1-L2 3.  At L1 to left facet arthropathy and disc bulging creates left lateral recess and foraminal narrowing. L2-3 there is also left disc bulging facet arthropathy creating left foraminal narrowing and lateral recess stenosis.  L3-4 right disc bulging creates right lateral recess stenosis and L4-5 bilateral bulky facet arthropathy creates bilateral lateral recess stenosis a little more right foraminal narrowing and bilateral L5 nerve roots are patent.  No significant central stenosis at L5-S1.    AP, lateral and spot views of the lumbar spine:  10/25/20  AP of the lumbar spine reveals dextroscoliosis the back and lumbar junction there is some rightward tilting of the L3-4 disc and lateral listhesis.  The curvature has progressed since her last x-rays and 2016.  I did review reveals multilevel degenerative changes and facet arthropathy.  No anterior listhesis is appreciated.  L5-S1 disc base actually looks fairly well-maintained.    X-ray impression: Lumbar scoliosis.      MRI Lumbar Spine without Contrast      Impression  Performed by [facility]  1.  Severe scoliosis with extensive degenerative change.   2.  As above.     Signed by: 06/22/2019 9:26 AM: Clemon Chambers    Narrative  Performed by [facility]    EXAM:  MRI Lumbar spine     COMPARISON:  X-ray      INDICATION:  M54.5 Low back pain I10;G89.29 Other chronic pain I10;;Worsening back pain in a patient with prominent scoliosis of the thoracolumbar region centered around L1.  Concerned about spinal stenosis.     TECHNICAL: Sagittal T1, sagittal T2, sagittal STIR, axial T1, and axial T2 sequences of the lumbar spine performed. Additional coronal T2 for scoliosis     FINDINGS:   Osseous structures:   Marked scoliosis is present as seen on x-ray. No marrow edema or other structural abnormalities seen.   Conus tip terminates at L1.   A T2 hyperintense area within the right kidney is likely a cyst.     T12-L1: Disc bulge is present with facet hypertrophy. Moderate foraminal narrowing is seen on the left.   L1-2:  Marked facet hypertrophy is present. Disc bulge is noted. There is marked foraminal narrowing on the left.   L2-3:  Marked facet hypertrophy is present. There is a small disc bulge with moderate foraminal narrowing bilaterally. Mild central stenosis is noted.   L3-4:  Marked facet hypertrophy is present. Circumferential disc bulge is present asymmetric to the right side. There is marked foraminal narrowing on the right and milder changes on the left. Moderate central stenosis is noted.   L4-5:  Marked facet hypertrophy is present. Circumferential disc bulge is noted asymmetric to the right side. This abuts the L4 nerve root on the right. Moderate foraminal narrowing seen bilaterally.   L5-S1:  Small central distribution is present without lateralized findings.Marked facet hypertrophy is noted.  MRI Thoracic Spine without Contrast      Impression  Performed by [facility]  1.  Widespread thoracic spondylosis is noted with multiple sites of potential clinically significant neural impingement noted as described in detail above. In addition mild dilatation of the central thoracic spinal cord canal noted. The site or sites of greatest apparent neural impingement are reiterated below.   2.  At T6-7 and T7-8 disc  protrusions abut and compress the thoracic spinal cord.   3. At T10-11 right-sided central canal and foraminal narrowing noted.   4. At T12-L1 and L1-2 left foraminal narrowing is noted.     Signed by: 06/22/2019 8:21 AM: Barry Dienes    Narrative  Performed by [facility]    EXAM:  MRI thoracic spine     COMPARISON:  Thoracic spine radiographs 03/10/2019     INDICATION:  M41.9 Scoliosis, unspecified I10;M54.5 Low back pain I10;M54.6 Pain in thoracic spine I10;M54.5 Low back pain I10;G89.29 Other chronic pain I10;;Worsening back pain in a patient with prominent scoliosis of the thoracolumbar spine centered around L1   Scoliosis of thoracolumbar spine; chronic intermittent back       TECHNICAL: Sagittal T1, sagittal T2, sagittal STIR, axial T1, and axial T2 sequences of the thoracic spine performed.     FINDINGS:   Osseous structures:   Vertebral morphology and alignment demonstrate no evidence of fracture infection or tumor. Dextroscoliosis of the lower thoracic and lumbar spine incompletely visualized.   Mild dilatation of the central canal the thoracic spinal cord is seen from T6-7 down to T11 with a maximum diameter of approximately 1.3 mm at the T7 level.   No signal abnormality in the visualized mediastinum nor retroperitoneum. Paraspinous musculature is unremarkable.     At T6-7, a right posterior paracentral disc protrusion is present with cardiomegaly migrated disc protrusion abutting the ventral aspect of the thoracic spinal cord with mild deformity at the mid to upper half of the T7 vertebral body.     At T7-8, a right posterior paracentral disc protrusion abuts and compresses the ventral aspect of the thoracic spinal cord and demonstrates extruded caudally migrated disc material along the anterior aspect of the spinal cord extending down to the mid body of T8.     At T10-11, right posterior paracentral disc osteophyte complex causes right-sided mild central canal and right foraminal narrowing. Mild bilateral  facet arthropathy is noted.     At T12-L1, disc osteophyte complex causes mild-to-moderate left foraminal narrowing. Bilateral facet arthropathy is present.     At L1-2, severe left intervertebral foraminal narrowing appears to be present with osteophyte formation from vertebral endplates and facet and left greaterthan right-sided facet arthropathy noted.    Assessment/Plan:         Diagnosis Orders   1. Scoliosis, or kyphoscoliosis, idiopathic  Ambulatory Referral to Spine Injection      2. DDD (degenerative disc disease), lumbar  Ambulatory Referral to Spine Injection      3. Foraminal stenosis of lumbar region  Ambulatory Referral to Spine Injection      4. Lumbar stenosis with neurogenic claudication  Ambulatory Referral to Spine Injection              This patient's clinical history and physical exam is consistent with a left  L-3  lumbar radiculopathy.  MRI scan shows there is some narrowing on the left around the L2 and III nerve roots from foraminal stenosis and from lateral recess stenosis.  It would be my recommendation  to address the left radicular symptoms with a left L2 and 3 selective nerve root block.  She also has some generalized lower back pain across the hips that may be coming from the mild stenosis at L4-5.  This may be addressed with L4-5 epidural.  Currently her symptoms are worse on the left leg and we are going to recommend a trial of lumbar injections and start with a left L2-3 and 4 selective nerve root block.        Typically we have also discussed surgical intervention and this would likely be extensive lumbar fusion to correct the degenerative scoliosis present.  [facility]    - Injection: The patient will be referred for a lumbar steroid injection to help reduce the symptoms. The patient understands the risks including hyperglycemia, immunosuppression, meningitis, cerebrospinal fluid leak, epidural hematoma, and reaction to medication. The  patient may benefit from additional injections depending on the response. The injection should be a left L2 and 3 selective nerve root block.  Following this injection if she is still having symptoms of lower back and even having bilateral hip pain, we may want to try L4-5 epidural.    Also may consider adding a lumbar scoliosis brace.    4 This is a chronic illness/condition with exacerbation and progression    No orders of the defined types were placed in this encounter.       Orders Placed This Encounter   Procedures    Ambulatory Referral to Spine Injection              Return for lumbar injection with [facility].     Wilmon Palimy S Jashaun Penrose, PA-C  11/09/20      Elements of this note were created using speech recognition software.  As such, errors of speech recognition may be present.

## 2020-11-09 NOTE — Progress Notes (Signed)
PER CHRIS W. W/ PAI   FAX REQ CLINIALS AND MRI REPORT TO # [phone] ATTN: NURSE REVIEW

## 2020-11-09 NOTE — Patient Instructions (Signed)
Epidural Steroid Injection / Selective Nerve Root Block  What is it?  An epidural steroid injection (ESI) is an injection of an anti-inflammatory medication directly on the sac that covers the spinal cord and nerves. A Selective Nerve Root Block (SNRB) is an injection that is directed around a specific nerve.   Your physician usually orders an injection  when you have symptoms of an irritated spinal nerve. These symptoms can include back, buttock or leg pain, weakness, or numbness.  Irritated spinal nerves are often caused by disc herniations or a tight spinal canal (stenosis).     The goal of the steroid injection is to reduce the inflammation around the spinal cord and nerves, which should reduce the amount of back and leg pain you are experiencing.    Epidural injections are often done in series.  It would not be unusual to have two or three injections in a row 10 to 14 days apart.  The reason for multiple injections is that the relief from the injection may wear off over time. And sometimes it takes multiple doses of steroid injection to obtain the most anti-inflammatory effect around the nerves.     How is it performed?  You will be asked to lie on your side or stomach on the x-ray table.  This will allow the physician to position you in the best way possible to access your back.  Next, the skin will be cleaned and numbed with a local anesthetic.  An X-ray machine will then be used to guide a small gauge needle into the space over your spinal sac. A small amount of dye will then be injected to insure the needle is in the proper position.  Finally, a mixture of numbing medicine and anti-inflammatory (steroid/cortisone) will be injected.      How long does it take?  All together it should take about 1 hour.  The back and legs may feel weak or numb for a couple of hours after the injection.   Plan the have someone drive you home.      Are there any restrictions after the ESI?   Try to spend the remainder of  the evening resting as much as possible.  You may return to your normal activities the day after the procedure, including returning to work.        What are the risks?  The most common risk is a spinal headache.  This happens when the needle passes through the spinal sac and can result in leakage of spinal fluid.    This is usually treated with lying in a flat position and high fluid intake.  Rarely, spinal headaches lasting more than a few days can be treated with a small procedure called a ???blood patch???.    Another risk, though very small, is the injection of the medication into one of the tiny blood vessels in the spinal canal.  This can result in serious complications such as seizures, cardiac arrest and even death.  Fortunately, these complications are quite rare.   Other rare complications include infection, bleeding into the spinal canal (epidural hematoma), loss of bladder control, and injury to a nerve with the needle.     Who should not have an ESI?  The injection of a steroid anywhere in the body can cause a significant increase in the blood sugar level.  Diabetics are very sensitive to steroids and should have them administered with caution. Diabetic patients can have injections but need to watch their   blood sugar after the injection and discuss with their Primary Care Physician a plan to manage elevations in blood sugar.   Steroids also decrease the body's ability to fight infection.  Thus, any person with an active infection should not take a steroid medication until the infection has been cleared completely.  If you are taking an antibiotic for an active infection, please notify our office.   Additionally, some patients may have anatomic abnormalities or have had previous back surgeries which may not allow the needle to pass into the spinal canal.     Medications that result in thinning of the blood such as coumadin, aspirin, Plavix, Eliquis, Xarelto and many anti-inflammatory medications need to be  discontinued 5-7 days prior to the injection. Check with your doctor regarding specific drugs you are taking. Med    [facility]    Christopher VanPelt, MD  Emmett Lucas, MD  Keneth Borg, PA-C  Jaime Elliotte, NP    Main Office  [address]  [place], [address]  [address] [facility]  [address] [place] [address]       For Appointments at either location, please call [phone] that result in thinning of blood such as Coumadin, Aspirin, Plavix, and many anti-inflammatories, need to Pre procedure teaching sheet: Epidural spinal injection, selective nerve root block injection, sacroiliac injection and facet block injections.      You have been scheduled for spinal injection.  This injection is to help decrease her back and or leg pain.  A local anesthetic will be used to numb the area.  Then, a spinal needle will be placed in the appropriate place according to the type of injection ordered by your physician.  A steroid with or without local anesthetic will be injected.  A small amount of contrast dye will be used to better visualize the injection site.  You will be lying on your stomach.  A series of 3 injections may be performed as these are ordered 2 to 3 weeks apart.  Be aware, steroids can take 2 to 3 days to begin working, but can take 7 to 10 days for full effectiveness.  Therefore, you may not have relief immediately.  Please be patient and give the injection time to work.  You should not resume any type of strenuous workout routine for at least 3 days following the procedure.  Walking is fine.    Prior to your procedure    - Please call your primary care physician if you are on blood thinners such as Coumadin, warfarin, heparin, Plavix, Lovenox, Effient, Eliquis, Xarelto, Brilinta, or aspirin or other blood thinner as these will need to be stopped for 3 to 7 days before the injections.  We will need verbal approval to stop the thinners and proceed with  the injection from the physician treating you with the blood thinners prior to the appointment date.  If your physician tells you it is not safe to stop the blood thinner, you must call our office and discuss this with us.  We may need to cancel the procedure.  - Please do not take any nonsteroidal anti-inflammatory medications (NSAIDs) such as Advil, ibuprofen, Celebrex, meloxicam or Aleve for 3 days before your injection.    - We cannot give you an injection if you are presently taking an antibiotic.  - Please continue your other medications as prescribed.-You must bring someone to drive you home.  - You may eat prior to your procedure, but we   asked that you do not eat a heavy meal within 2 to 4 hours of your procedure.  You may drink liquids up to 1 to 2 hours before your appointment time.  - If you are diabetic, please adjust your medications as appropriate.  If steroid is used be aware that they may increase your blood sugar.  Closely monitor your blood sugars after the procedure.  - If you are sick, running a fever, have a virus or are put on antibiotics during the week before your procedure, please call to reschedule.  We cannot do injections if you are sick.      Day of procedure    - Arrive 15 minutes before your procedure time, register at the front desk.  - A staff member will call you from the waiting area and bring you to the exam room.  - You may or may not be asked to change into shorts.  Please leave valuables at home.  If you wear clothing with elastic waist, you will not be required to change.  - The nurse will talk with you and have you sign a consent form before your injection.  If you have any allergies to Betadine, iodine, shellfish or x-ray dye, please tell the nurse at this time.  - You will be positioned on the table on your stomach.  A special x-ray machine is used to mark the correct position of the needle.  We will clean the area with Betadine or Hibiclens.  Sterile towels were placed  around the area to be injected.  - The doctor will use a local anesthetic to numb the skin.  This burns like a bee sting for about 20 seconds.  As the needle is advanced, you will feel pressure.  - Once the needle is in the appropriate space, the medication will be injected.  Once the needle is removed, your back will be cleaned.  You will move back to the exam room.  - You are required to stay 20 to 30 minutes following the procedure.  Although not expected you may have some numbness from the local anesthetic.  If this were to interfere with her walking, you will not be discharged from the office until it is safe for you to leave.  - Your discharge instructions and follow-up care will be discussed with you prior to leaving the office.          PRODUCTS THAT MAY THIN THE BLOOD    Medications, over-the-counter medications and herbal supplements    Aches-N-Pain    Disalcid   Meclenofenamate   Plavix  Acetylsalicylic acid (ASA)  Doan's Pills   Meclomen    Ponstel  Actron     Dolobid   Medipren    Relefen  Advil     Dristan    Mefenamic acid   Robaxisal  Aleve     Ecotrin    Meloxicam    Rufen  Alka-Seltzer    Empirin   Menadol    Saleto  Anacin     Equagesic   Methocarbamol   Salsalate  Anaprox    Etodolac   Midol     Sine-aid  Ansaid     Excedrin   Mobic     Sine-off  Arthritis Pain formula   Feldene   Motrin     Sominex  Ascriptin    Fenoprofen   Nabumetone    Stanback  Aspergum    Feverfew   Nalfon     Sulindac    Aspirin     Florinal   Naprosyn    Talwin Compound  Bayer     Flurbiprofen   Naproxen    Ticlid  BC Powders    Genpril    Norgesic    iclopidine  Bufferin    Goody's   Nuprin     Tolectin  Cataflam    Haltrin    Nyquil     Tolmetin  Clinoril     IBU    Orudis     Toradol  Clopidogrel    Ibuprofen   Oruvail     Trental  Coricidin    Indocin    Oxaprozin    Triclosan  Coumadin    Indomethacin   Pamprin    Vanquish  Darvon Compound   Ketoprofen   Pepto Bismol    Vitamin -  E  Daypro     Ketorolac   Percodan    Voltaren  Diflunisal Kaopectate   Lovenox   Piroxicam    Warfarin    Diclofenac Lodine       Phenaphen    Xarelto  Eliquis       Enoxaparin

## 2020-11-09 NOTE — Progress Notes (Signed)
A referral for spine injection has been ordered.  After that we will request L4-5 ESI.

## 2020-11-10 ENCOUNTER — Encounter: Payer: PRIVATE HEALTH INSURANCE | Primary: Internal Medicine

## 2020-11-11 NOTE — Progress Notes (Signed)
08/31 NOTE RECEIVED THIS AM, FAXED REQ, CLINICALS AND MRI REPORT

## 2020-11-15 ENCOUNTER — Encounter

## 2020-11-15 NOTE — Progress Notes (Signed)
A referral to surgical spine has been placed. Dr. Lavena Bullion.

## 2020-11-17 ENCOUNTER — Encounter: Payer: PRIVATE HEALTH INSURANCE | Primary: Internal Medicine

## 2020-11-22 ENCOUNTER — Encounter: Payer: PRIVATE HEALTH INSURANCE | Attending: Surgical | Primary: Internal Medicine

## 2020-11-24 ENCOUNTER — Ambulatory Visit: Admit: 2020-11-24 | Discharge: 2020-11-24 | Payer: PRIVATE HEALTH INSURANCE | Primary: Internal Medicine

## 2020-11-24 ENCOUNTER — Ambulatory Visit
Admit: 2020-11-24 | Discharge: 2020-11-24 | Payer: PRIVATE HEALTH INSURANCE | Attending: Physical Medicine & Rehabilitation | Primary: Internal Medicine

## 2020-11-24 DIAGNOSIS — M5136 Other intervertebral disc degeneration, lumbar region: Secondary | ICD-10-CM

## 2020-11-24 MED ORDER — BETAMETHASONE SOD PHOS & ACET 6 (3-3) MG/ML IJ SUSP
6 (3-3) MG/ML | Freq: Once | INTRAMUSCULAR | Status: AC
Start: 2020-11-24 — End: 2020-11-24
  Administered 2020-11-24: 14:00:00 6 mg

## 2020-11-24 MED ORDER — TRIAMCINOLONE ACETONIDE 40 MG/ML IJ SUSP
40 MG/ML | Freq: Once | INTRAMUSCULAR | Status: DC
Start: 2020-11-24 — End: 2020-11-24

## 2020-11-24 NOTE — Progress Notes (Signed)
Name: Monique Barker  Date of Birth: 25-Aug-1955  Gender: female  MRN: [id number]        Transforaminal ESI Procedure Note    Procedure: Left  L2-L3 and L3-L4 transforaminal epidural steroid injections     Precautions: [name] reports prior sensitivity to steroid, local anesthetic, iodine, or shellfish.   She reports allergy to oral methylprednisolone but reports she has had multiple steroid injections in the past without difficulty/complications/side effects.      Consent:  Consent was obtained prior to the procedure. The procedure was discussed at length with [name]. She was given the opportunity to ask questions regarding the procedure and its associated risks.  In addition to the potential risks associated with the procedure itself, the patient was informed both verbally and in writing of potential side effects of the use glucocorticoids.  The patient appeared to comprehend the informed consent and desired to have the procedure performed, and informed consent was signed.     She was placed in a prone position on the fluoroscopy table and the skin was prepped and draped in a routine sterile fashion. The areas to be injected were each anesthetized with 1 ml of 1% Lidocaine. A 22 gauge 3.5 inch spinal needle was carefully advanced under fluoroscopic guidance to the left L3-L4 transforaminal space  0.5 ml of 70% of Omnipaque was injected to confirm proper needle placement and absence of subdural or vascular flow Once proper placement was confirmed, 0.40ml of 0.25 marcaine and 3 mg of betamethasone were injected through the spinal needle.       The above procedure was then repeated at the Left  L2-L3 transforaminal space using 3 mg of betamethasone    Fluoroscopic guidance was used intermittently over a 10-minute period to insure proper needle placement and her safety. A hard copy of the fluoroscopic image has been placed in her chart and is saved on the C-arm hard drive. She was monitored  for 30 minutes after the procedure and discharged home in a stable fashion with a routine follow up.    Procedural Diagnosis:     ICD-10-CM    1. DDD (degenerative disc disease), lumbar  M51.36 FL NERVE BLOCK LUMBOSACRAL 1ST     FL NERVE BLOCK LUMBOSACRAL EACH ADD     betamethasone acetate-betamethasone sodium phosphate (CELESTONE) injection 6 mg           2. Lumbar stenosis with neurogenic claudication  M48.062 FL NERVE BLOCK LUMBOSACRAL 1ST     FL NERVE BLOCK LUMBOSACRAL EACH ADD     betamethasone acetate-betamethasone sodium phosphate (CELESTONE) injection 6 mg           3. Lumbar radiculopathy  M54.16 FL NERVE BLOCK LUMBOSACRAL 1ST     FL NERVE BLOCK LUMBOSACRAL EACH ADD     betamethasone acetate-betamethasone sodium phosphate (CELESTONE) injection 6 mg              Ferrel Logan, MD  11/24/20

## 2020-12-21 ENCOUNTER — Ambulatory Visit
Admit: 2020-12-21 | Discharge: 2020-12-21 | Payer: PRIVATE HEALTH INSURANCE | Attending: Surgical | Primary: Internal Medicine

## 2020-12-21 DIAGNOSIS — M5136 Other intervertebral disc degeneration, lumbar region: Secondary | ICD-10-CM

## 2020-12-21 MED ORDER — GABAPENTIN 300 MG PO CAPS
300 MG | ORAL_CAPSULE | Freq: Three times a day (TID) | ORAL | 2 refills | Status: AC
Start: 2020-12-21 — End: 2021-03-21

## 2020-12-21 NOTE — Progress Notes (Signed)
Name: Monique Barker  Date of Birth: Feb 17, 1956  Gender: female  MRN: [id number]    CC: Back Pain (Follow up after injection with [facility] 11/24/20)       HPI: This is a 65 y.o. year old female who I have seen in the past for lower back pain and pain radiating into her hip or chronic bursitis.  She had a mild lumbar scoliosis at that time.  We referred her to physical therapy with Melissa Powers.   She began having more pain not just in the left bursal area radiated into the left anterior leg and the entire left leg.  The symptoms are much worse with standing or walking.   She does have pain at night.  She has had Voltaren, gabapentin and over the past 6 months has had over 20 visits with physical therapy.    The patient denies any change in bowel or bladder function since the onset of the symptoms. she  has not had lumbar surgery in the past.    We recommended a new MRI scan of the lumbar spine.  I did note on her lumbar x-rays that Her dextroscoliosis has significantly advanced over the past 5 years with apex being at L1-2 and L2-3 on the left which I felt was likely the source of her left radicular symptoms.  She does have dextroscoliosis with apex of the scoliosis being L1-L2 3.  At L1 to left facet arthropathy and disc bulging creates left lateral recess and foraminal narrowing. L2-3 there is also left disc bulging facet arthropathy creating moderate to severe left foraminal narrowing and lateral recess stenosis.  L3-4 right disc bulging creates right lateral recess stenosis and L4-5 bilateral bulky facet arthropathy creates bilateral lateral recess stenosis a little more right foraminal narrowing and bilateral L5 nerve roots are patent.  No significant central stenosis at L5-S1.    Furred her for a left L2 and 3 selective nerve root block.  She said for 2 days she had no pain.  She still has had excellent relief of her left groin pain and anterior leg pain.  She has played tennis without significant pain.  She has  walked without severe pain.  She continues gabapentin and Voltaren.  The pain she has is mostly at night especially when sleeping on her left side and this is more on the lateral leg to the top of her foot.    Reviewed the MRI scan again do not see significant nerve compression in the L4-5 distribution however her curve apex is at L2-3 and this is the area with the most left-sided stenosis and she may be getting more of a generalized central stenosis when she is lying on that left side contributing to the lateral leg pain.       ROS/Meds/PSH/PMH/FH/SH: I personally reviewed the patient's collected intake data.  Below are the pertinents:    Allergies   Allergen Reactions    Pravastatin Myalgia    Methylprednisolone Hives     Patient states this only happened once with a taper.  Taken prior stable doses for several days with no reaction. No prior reactions with steroid injections in the past.         Current Outpatient Medications:     gabapentin (NEURONTIN) 300 MG capsule, Take 1 capsule by mouth 3 times daily for 90 days. Intended supply: 30 days, Disp: 90 capsule, Rfl: 2    Coenzyme Q10 100 MG TABS, Take by mouth, Disp: , Rfl:  Estradiol (VAGIFEM) 10 MCG TABS vaginal tablet, Insert one tablet vaginally twice weekly at night., Disp: , Rfl:     rosuvastatin (CRESTOR) 5 MG tablet, Take 5 mg by mouth, Disp: , Rfl:     CALCIUM PO, Take by mouth, Disp: , Rfl:     CRANBERRY PO, Take by mouth, Disp: , Rfl:     TURMERIC PO, Take by mouth (Patient not taking: Reported on 10/25/2020), Disp: , Rfl:     ZINC PO, Take by mouth, Disp: , Rfl:     ascorbic acid (VITAMIN C) 500 MG tablet, Take by mouth daily (Patient not taking: Reported on 10/25/2020), Disp: , Rfl:     aspirin 81 MG EC tablet, Take 81 mg by mouth daily (Patient not taking: Reported on 10/25/2020), Disp: , Rfl:     Cholecalciferol 50 MCG (2000 UT) TABS, Take by mouth daily, Disp: , Rfl:     diclofenac (VOLTAREN) 75 MG EC tablet, Take by mouth, Disp: , Rfl:      fexofenadine (ALLEGRA) 180 MG tablet, Take by mouth, Disp: , Rfl:     hydroCHLOROthiazide (HYDRODIURIL) 12.5 MG tablet, Take 12.5 mg by mouth daily (Patient not taking: Reported on 10/25/2020), Disp: , Rfl:     montelukast (SINGULAIR) 10 MG tablet, Take 10 mg by mouth, Disp: , Rfl:     omeprazole (PRILOSEC) 20 MG delayed release capsule, Take 20 mg by mouth daily, Disp: , Rfl:     Past Surgical History:   Procedure Laterality Date    ANESTH,SURGERY OF SHOULDER Right     arthroscopy    ORTHOPEDIC SURGERY      left ankle    PR ABDOMEN SURGERY PROC UNLISTED      chole       There is no problem list on file for this patient.        Tobacco:  reports that she has never smoked. She has never used smokeless tobacco.  Alcohol:   Social History     Substance and Sexual Activity   Alcohol Use No          Radiographic Studies:     MRI Result (most recent):  MRI LUMBAR SPINE WO CONTRAST 11/09/2020    Narrative  History: Low back pain, unspecified; Other idiopathic scoliosis, site  unspecified; Radiculopathy, lumbar region    Exam: MRI lumbar spine without contrast    Technique: Axial and sagittal T1 and T2-weighted sequences are available for  review.  A sagittal STIR sequence is also available for review.    Comparison: 11/08/2014    Findings:    There is S-shaped scoliotic curvature of the included thoracolumbar spine.  Degenerative disc signal present L1-2 through L4-5 with varying degrees of disc  height loss. The conus is normal in configuration and signal intensity  throughout. Axial imaging demonstrates a cyst involving the lower pole the right  kidney measuring 2.6 cm. No additional abnormal retroperitoneal lesions  demonstrated.    L1-2: There is a disc bulge with bilateral facet arthropathy. There is mild left  neural foraminal narrowing.    L2-3: There is a disc bulge with bilateral facet arthropathy. There is mild left  neural foraminal narrowing and mild central stenosis.    L3-L4: There is a disc bulge with  bilateral facet arthropathy. No central or  neural foraminal narrowing.    L4-L5: There is a disc bulge with bilateral facet arthropathy. No central  stenosis. No neural foraminal narrowing.    L5-S1: There is a  disc bulge with bilateral facet arthropathy. There is fluid  within both facets. No central or neural foraminal stenosis.    Impression  Impression:  1. S-shaped scoliotic curvature of the thoracolumbar spine.  2. Multilevel lumbar spondylosis with mild left neural foraminal narrowing at  L1-2 and L2-3. These areas of neural foraminal narrowing are new when compared  with the prior exam. No significant additional interval change.   11/09/20 independently reviewed the MRI of the lumbar spine.  She does have dextroscoliosis with apex of the scoliosis being L1-L2 3.  At L1 to left facet arthropathy and disc bulging creates left lateral recess and foraminal narrowing. L2-3 there is also left disc bulging facet arthropathy creating left foraminal narrowing and lateral recess stenosis.  L3-4 right disc bulging creates right lateral recess stenosis and L4-5 bilateral bulky facet arthropathy creates bilateral lateral recess stenosis a little more right foraminal narrowing and bilateral L5 nerve roots are patent.  No significant central stenosis at L5-S1.    AP, lateral and spot views of the lumbar spine:  10/25/20  AP of the lumbar spine reveals dextroscoliosis the back and lumbar junction there is some rightward tilting of the L3-4 disc and lateral listhesis.  The curvature has progressed since her last x-rays and 2016.  I did review reveals multilevel degenerative changes and facet arthropathy.  No anterior listhesis is appreciated.  L5-S1 disc base actually looks fairly well-maintained.    X-ray impression: Lumbar scoliosis.      MRI Lumbar Spine without Contrast      Impression  Performed by [facility]  1.  Severe scoliosis with extensive degenerative change.   2.  As above.     Signed by: 06/22/2019 9:26 AM:  Clemon Chambers    Narrative  Performed by [facility]    EXAM:  MRI Lumbar spine     COMPARISON:  X-ray     INDICATION:  M54.5 Low back pain I10;G89.29 Other chronic pain I10;;Worsening back pain in a patient with prominent scoliosis of the thoracolumbar region centered around L1.  Concerned about spinal stenosis.     TECHNICAL: Sagittal T1, sagittal T2, sagittal STIR, axial T1, and axial T2 sequences of the lumbar spine performed. Additional coronal T2 for scoliosis     FINDINGS:   Osseous structures:   Marked scoliosis is present as seen on x-ray. No marrow edema or other structural abnormalities seen.   Conus tip terminates at L1.   A T2 hyperintense area within the right kidney is likely a cyst.     T12-L1: Disc bulge is present with facet hypertrophy. Moderate foraminal narrowing is seen on the left.   L1-2:  Marked facet hypertrophy is present. Disc bulge is noted. There is marked foraminal narrowing on the left.   L2-3:  Marked facet hypertrophy is present. There is a small disc bulge with moderate foraminal narrowing bilaterally. Mild central stenosis is noted.   L3-4:  Marked facet hypertrophy is present. Circumferential disc bulge is present asymmetric to the right side. There is marked foraminal narrowing on the right and milder changes on the left. Moderate central stenosis is noted.   L4-5:  Marked facet hypertrophy is present. Circumferential disc bulge is noted asymmetric to the right side. This abuts the L4 nerve root on the right. Moderate foraminal narrowing seen bilaterally.   L5-S1:  Small central distribution is present without lateralized findings.Marked facet hypertrophy is noted.      MRI Thoracic Spine without Contrast      Impression  Performed by [facility]  1.  Widespread thoracic spondylosis is noted with multiple sites of potential clinically significant neural impingement noted as described in detail above. In addition mild dilatation of the central thoracic spinal cord canal noted. The  site or sites of greatest apparent neural impingement are reiterated below.   2.  At T6-7 and T7-8 disc protrusions abut and compress the thoracic spinal cord.   3. At T10-11 right-sided central canal and foraminal narrowing noted.   4. At T12-L1 and L1-2 left foraminal narrowing is noted.     Signed by: 06/22/2019 8:21 AM: Barry Dienes    Narrative  Performed by [facility]    EXAM:  MRI thoracic spine     COMPARISON:  Thoracic spine radiographs 03/10/2019     INDICATION:  M41.9 Scoliosis, unspecified I10;M54.5 Low back pain I10;M54.6 Pain in thoracic spine I10;M54.5 Low back pain I10;G89.29 Other chronic pain I10;;Worsening back pain in a patient with prominent scoliosis of the thoracolumbar spine centered around L1   Scoliosis of thoracolumbar spine; chronic intermittent back       TECHNICAL: Sagittal T1, sagittal T2, sagittal STIR, axial T1, and axial T2 sequences of the thoracic spine performed.     FINDINGS:   Osseous structures:   Vertebral morphology and alignment demonstrate no evidence of fracture infection or tumor. Dextroscoliosis of the lower thoracic and lumbar spine incompletely visualized.   Mild dilatation of the central canal the thoracic spinal cord is seen from T6-7 down to T11 with a maximum diameter of approximately 1.3 mm at the T7 level.   No signal abnormality in the visualized mediastinum nor retroperitoneum. Paraspinous musculature is unremarkable.     At T6-7, a right posterior paracentral disc protrusion is present with cardiomegaly migrated disc protrusion abutting the ventral aspect of the thoracic spinal cord with mild deformity at the mid to upper half of the T7 vertebral body.     At T7-8, a right posterior paracentral disc protrusion abuts and compresses the ventral aspect of the thoracic spinal cord and demonstrates extruded caudally migrated disc material along the anterior aspect of the spinal cord extending down to the mid body of T8.     At T10-11, right posterior paracentral  disc osteophyte complex causes right-sided mild central canal and right foraminal narrowing. Mild bilateral facet arthropathy is noted.     At T12-L1, disc osteophyte complex causes mild-to-moderate left foraminal narrowing. Bilateral facet arthropathy is present.     At L1-2, severe left intervertebral foraminal narrowing appears to be present with osteophyte formation from vertebral endplates and facet and left greaterthan right-sided facet arthropathy noted.    Assessment/Plan:         Diagnosis Orders   1. DDD (degenerative disc disease), lumbar        2. Lumbar stenosis with neurogenic claudication        3. Lumbar radiculopathy        4. Scoliosis, or kyphoscoliosis, idiopathic        5. Foraminal stenosis of lumbar region              This patient's clinical history and physical exam is consistent with a left  L-3  lumbar radiculopathy.  MRI scan shows there is some narrowing on the left around the L2 and III nerve roots from foraminal stenosis and from lateral recess stenosis.      She has had notable relief after this injection.  She still having some left lateral leg pain and that may be  from more central compression at L2-3 other than that I do not see significant left-sided compression.  We talked about another injection in the future we may want to do L2-3 epidural steroid injection.    She has pain with standing in the kitchen.  We talked about a scoliosis brace and I will refer her to be fit for scoliosis brace to use when she is standing and doing housework and work in her kitchen.  She has an appointment at [facility] for January.   I am recommending a lumbar brace to help support weakened spinal muscles by restricting trunk mobility and provide distraction for the areas of disc degeneration.      - A lumbar orthosis was prescribed in an attempt to reduce pain by restricting truck mobility. The patient was counseled that the she should not wear this greater than four hours per day because of risks of  paraspinal muscle deconditioning with prolonged use of the lumbar brace. The brace should not be worn during periods of sleep.   - Patient will call for lumbar steroid injection and we will schedule a L2-3 epidural steroid injection    Has an appointment with physical therapy on Monday however its been very difficult to see Melissa.  If she desires to be referred to [facility] orthopedics for physical therapy I will get her in with Casimiro Needle.    4 This is a chronic illness/condition with exacerbation and progression    Orders Placed This Encounter   Medications    gabapentin (NEURONTIN) 300 MG capsule     Sig: Take 1 capsule by mouth 3 times daily for 90 days. Intended supply: 30 days     Dispense:  90 capsule     Refill:  2        Over 50% of this visit of 45 minutes was spent reviewing prior medical records and imaging, current imaging, diagnosis, treatment options, patient counseling and/or patient cooridination of care.       No orders of the defined types were placed in this encounter.       Return for call for injection.     Wilmon Pali, PA-C  12/21/20      Elements of this note were created using speech recognition software.  As such, errors of speech recognition may be present.

## 2021-03-22 NOTE — Telephone Encounter (Signed)
Patient signed release to have records mailed to her and also to [facility]. Releases sent to scanning.

## 2021-04-22 IMAGING — CT CT CARDIAC CALCIUM SCORING
3 series · 14 of 20 positions shown, 16 images · non-contrast
Comparison: None

REASON FOR EXAM: Chest pain going to the back, shortness of breath, chronic obstructive pulmonary disease, atrial fibrillation.
TECHNIQUE: CT images of the heart were acquired without the administration of IV contrast.  Post processing software quantified calcium (Threshold 130 HU) in the coronary arteries with Agatston scores. Coronary screening is most appropriately used in asymptomatic patients with clinical risk factors for coronary atherosclerosis.

[Series 2: calcium score · axial · 0.35mm/px · z∈[+1489,+1597]mm · 8 of 94 slices shown, 10 images]
[im 11/94  vessel]
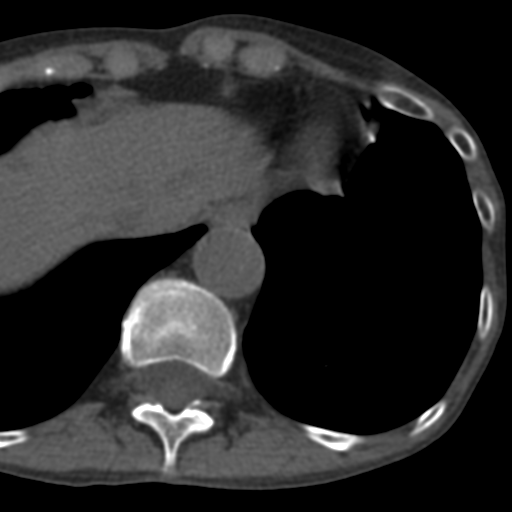
[im 11/94  lung]
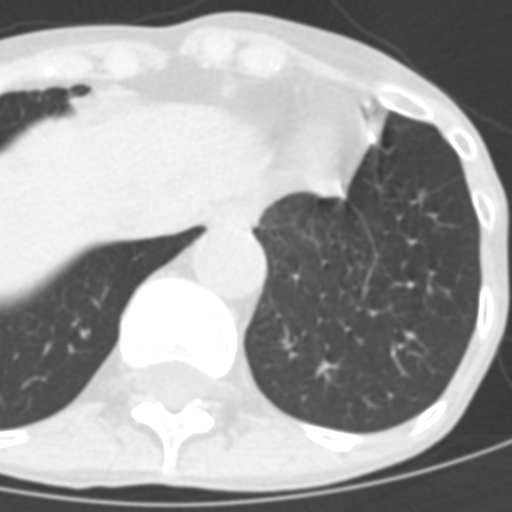
[im 21/94  vessel]
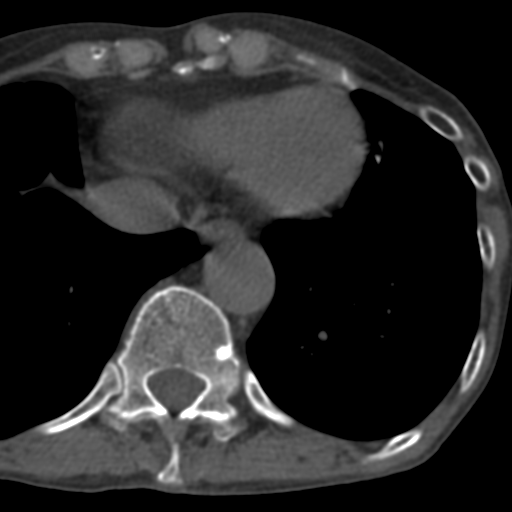
[im 32/94  vessel]
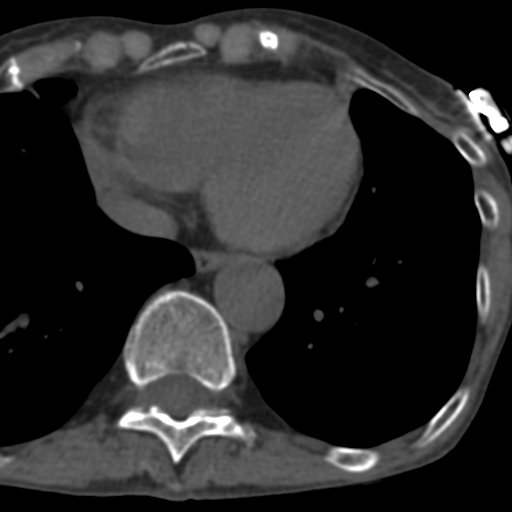
[im 42/94  vessel]
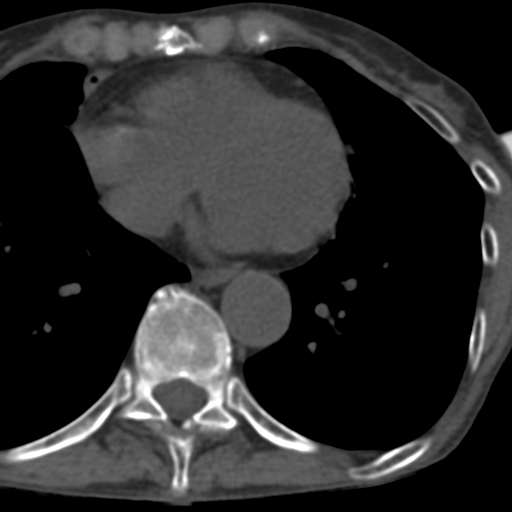
[im 52/94  vessel]
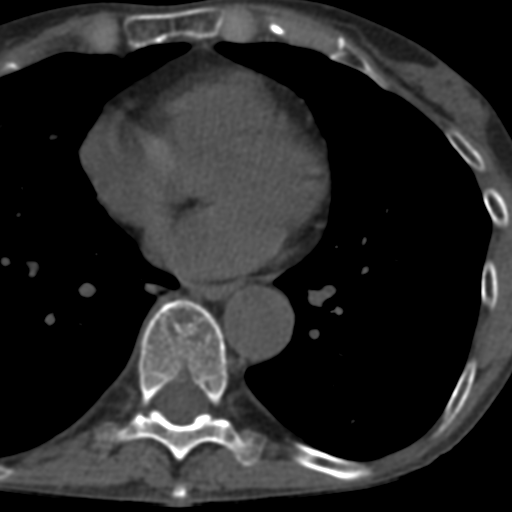
[im 52/94  lung]
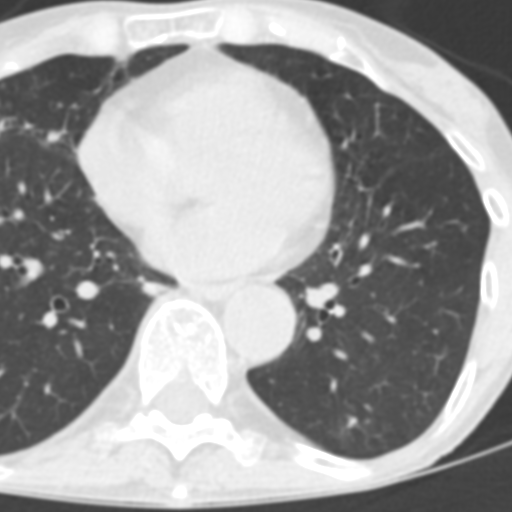
[im 63/94  vessel]
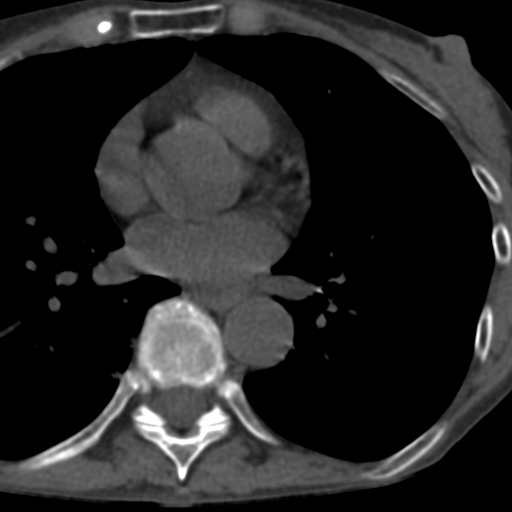
[im 73/94  vessel]
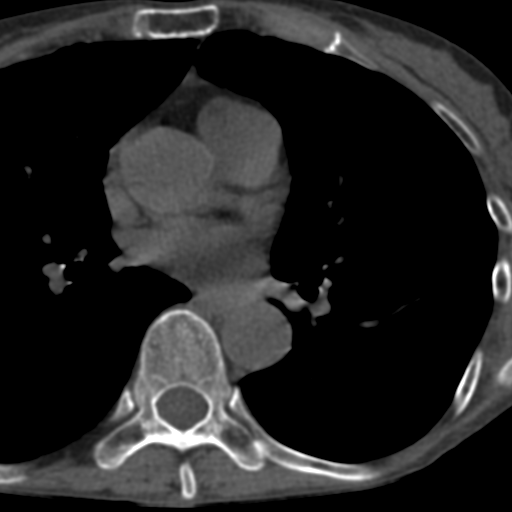
[im 83/94  vessel]
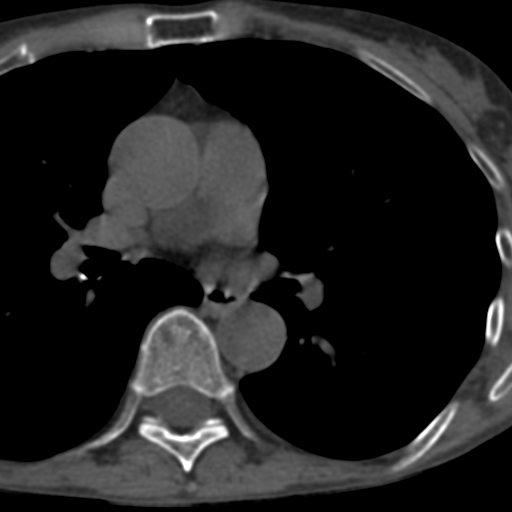

[Series 3: soft tissue · axial · 0.29mm/px · z∈[+1509,+1578]mm · 3 of 47 slices shown]
[im 12/47  vessel]
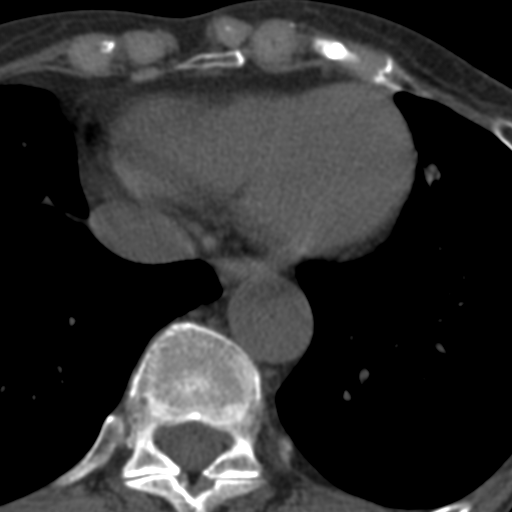
[im 24/47  vessel]
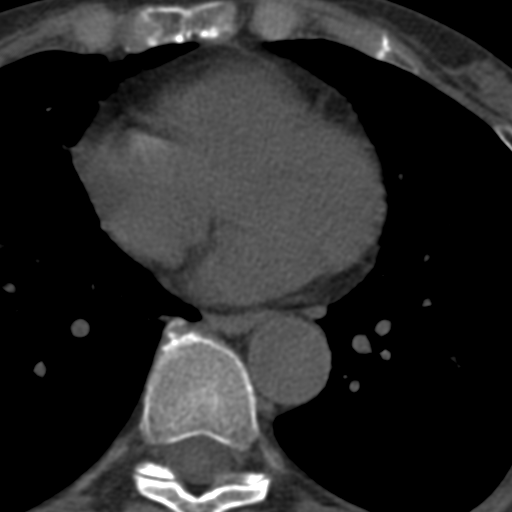
[im 35/47  vessel]
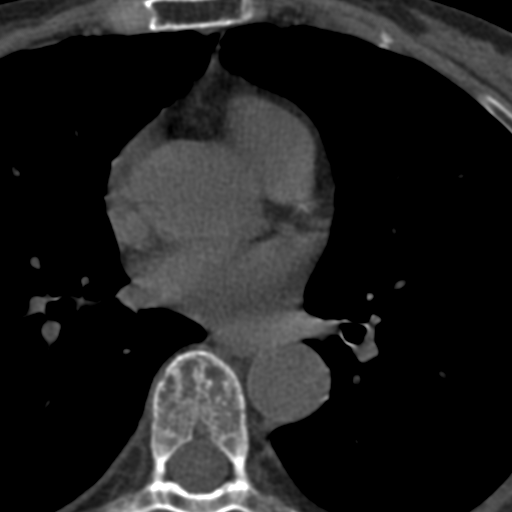

[Series 4: lung · axial · 0.50mm/px · z∈[+1508,+1577]mm · 3 of 47 slices shown]
[im 12/47  lung]
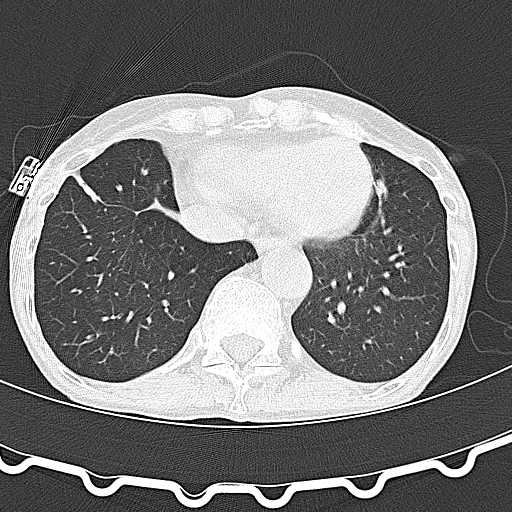
[im 24/47  lung]
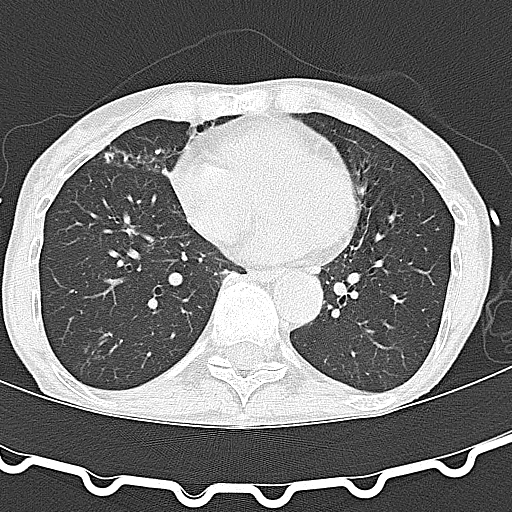
[im 35/47  lung]
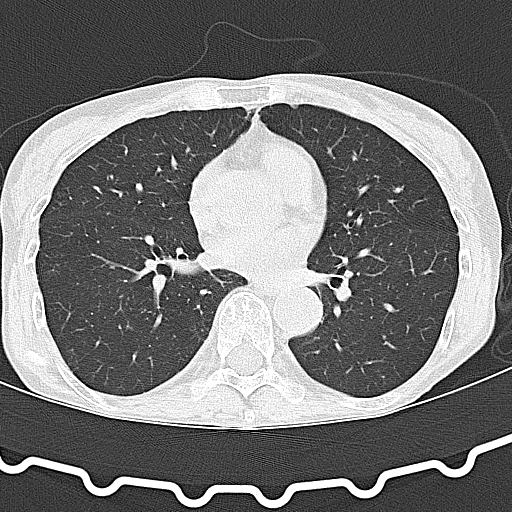

[14 of 20 positions shown; findings below may reference images not displayed]

Dose reduction technique used: automated exposure control and adjustment of the mA and/or kV according to patient size. CT count within the previous 12 months: 0

Total radiation dose to patient is CTDIvol 3.52 mGy and DLP 62.30 mGy-cm.

Number of Calcified Coronary Artery Plaques

Left Main: 0

Left Anterior Descending: 0

Circumflex: 0

Right Coronary Artery: 0

Total: 0

Coronary Artery Calcium Score

Left Main: 0

Left Anterior Descending: 0

Circumflex: 0

Right Coronary Artery: 0

TOTAL AGATSTON SCORE: 0

OTHER FINDINGS:  Subtle bibasal tree-in-bud opacities are identified in the right lower lobe with associated bronchitis in the right middle lobe and lingular segment left upper lobe.

IMPRESSION

1. Agatston score of 0; there is no CT evidence of coronary atherosclerosis and findings indicate a very low future risk of coronary event.

2. Tree-in-bud infiltrate with bronchiolitis in lung bases can be seen in atypical pulmonary infection such as mycobacterium avium intracellulare and fungal organism. Consider further evaluation with high-resolution CT chest.

Reference Table

*
0: No evidence of CAD (very low risk of future coronary event)

*
1-10: Minimal CAD (low risk of future coronary event)

*
11-100: Mild CAD (increased risk of future coronary event)

*
101-400: Moderate CAD (relative lifetime risk of coronary event is 4.3 times a patient with a score of 0)*

*
866-0666: Severe CAD (relative lifetime risk of coronary event is 7.2 times a patient with a score of 0)*

*
>9444 (relative lifetime risk of coronary event is 10.8 times a patient with a score of 0)*

*Tambriz, et al, Clinical expert consensus document on coronary calcium screening, J Am Coll Cardiology. 4227; [DATE]

Clinical risk factors can be combined with Agatston coronary calcium score to determine 10 year risk for coronary event using MESA risk calculator at:

[URL]

## 2021-04-27 IMAGING — CT CTA CHEST WO-W CONTRAST
3 of 8 series · 6 of 16 positions shown, 7 images · IV contrast (agent unspecified)
Comparison: CT chest study dated 12/31/2019.

HISTORY: 65-year-old Female with chest pain, evaluate for dissection.
TECHNIQUE: Pre- and postcontrast helical CT acquisition from lung bases to apices with sagittal and coronal reformats. 2-D and 3-D reconstructed images were also obtained. Post-processing software generated rotating 3-D and MIP images performed under concurrent physician supervision.

[Series 7: angio · axial · 0.48mm/px · z∈[+1444,+1772]mm · 3 of 165 slices shown, 4 images]
[im 1/165  soft-tissue]
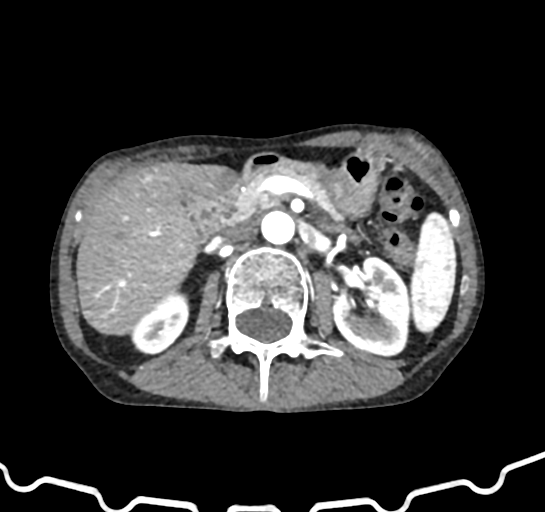
[im 1/165  bone]
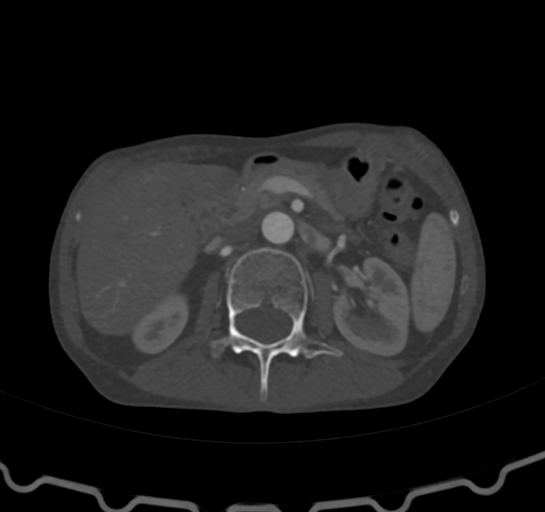
[im 83/165  soft-tissue]
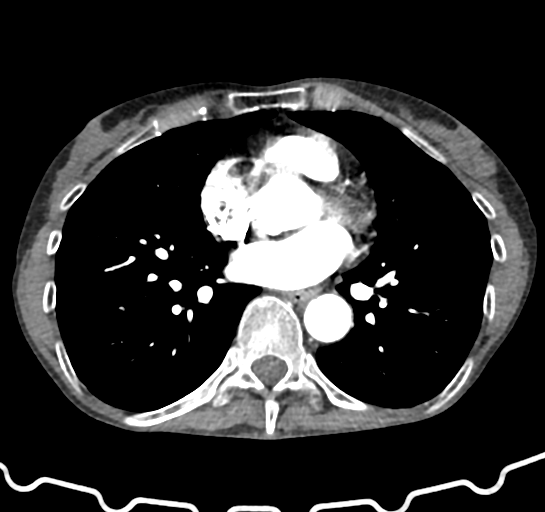
[im 165/165  soft-tissue]
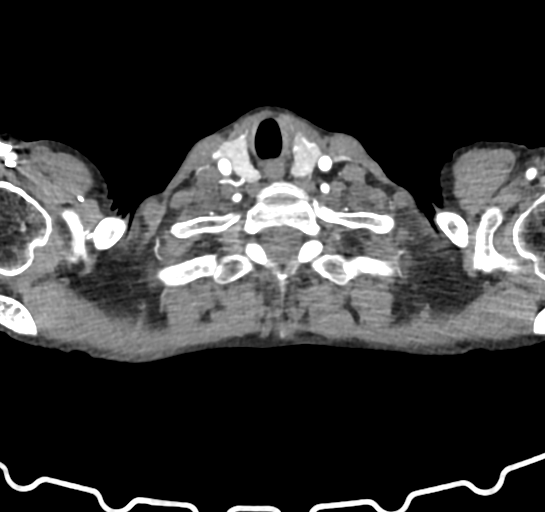

[Series 11: sagittal · sagittal · 0.48mm/px · 1 of 131 slices shown]
[im 66/131  soft-tissue]
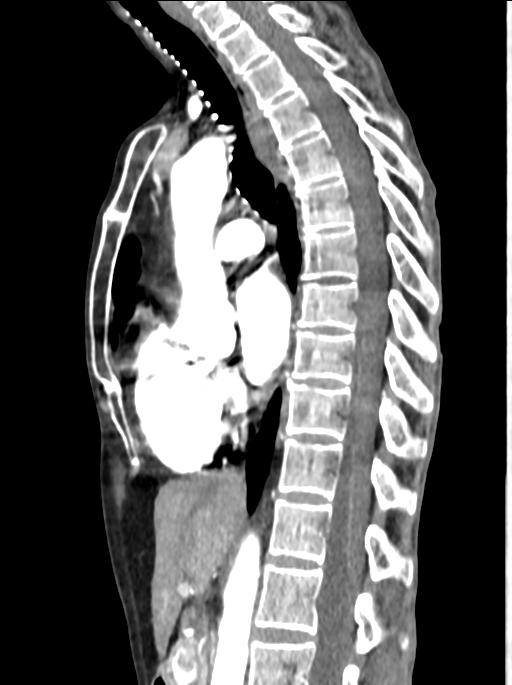

[Series 13: lung · axial · 0.41mm/px · z∈[+1555,+1665]mm · 2 of 165 slices shown]
[im 55/165  bone]
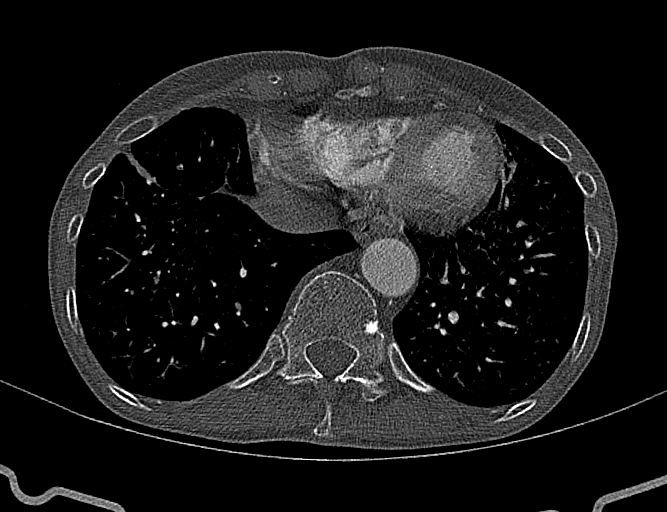
[im 110/165  bone]
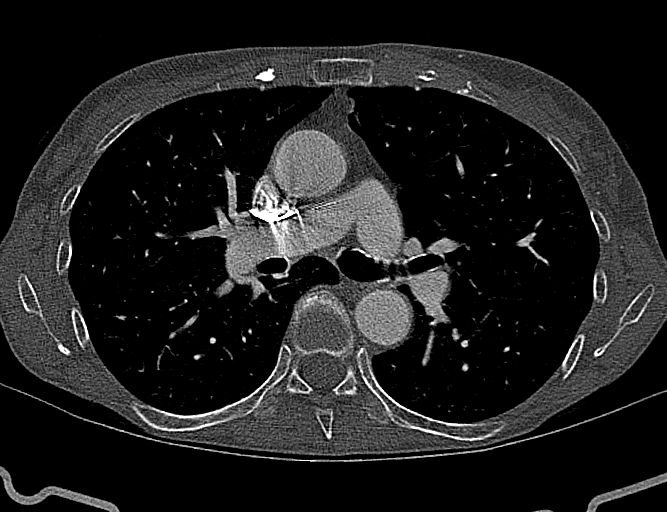

[6 of 16 positions shown; findings below may reference images not displayed]

Dose reduction technique used: Automated exposure control and adjustment of the mA and/or kV according to patient size. CT count in previous 12 months: 1

IV contrast: 100 mL [id number] intravenous contrast. 

Serum creatinine draw 0.7 mg/dL by i-STAT.

Total radiation dose to patient is CTDIvol 7.45 mGy and DLP 88.61 mGy-cm.
FINDINGS: Pulmonary arteries: Technically adequate examination. The pulmonary arteries can be evaluated to the segmental level. No filling defects are seen. The main pulmonary artery is normal in caliber.

Airways: Patent.

Lungs: Cluster of nodules present in the posterior left upper lobe and the right middle lobe of the lungs. Largest left upper lobe lung nodule measures 6.3 mm in its greatest diameter. The largest right middle lobe nodule measures 5.5 mm in its greatest dimension. There are also scattered clusters of nodules identified in the right posterior lower lobe ([DATE]). Cluster of small nodules also present in the medial right lower lobe ([DATE]).

Pleura: No pleural effusion or pneumothorax.

Heart and mediastinum: Mild cardiomegaly. No evidence of right heart strain. No significant coronary arterial calcifications. No pericardial effusion. The distal esophagus is unremarkable.

Thoracic aorta: Normal caliber. Mild atherosclerotic calcifications. No evidence of dissection. 

Lymph nodes: Normal in size.

Lower neck: Unremarkable.

Chest wall: Normal.

Upper abdomen: Unremarkable.

Bones: Unremarkable.
IMPRESSION: 1.
No evidence of emboli in the major pulmonary arteries to the segmental level.

2.
Clusters of nodules identified in the posterior left upper lobe, right middle lobe and right lower lobe of the lungs. Infectious versus inflammatory process are favored. Atypical infectious processes TB or fungal are in the differential diagnosis.

3.
Mild cardiomegaly.

## 2021-11-28 LAB — HEMOGLOBIN A1C: Hemoglobin A1C, External: 5.5 % (ref 3.2–7.0)

## 2021-12-07 NOTE — Telephone Encounter (Signed)
Left a message to call back to schedule follow up with [facility].

## 2021-12-11 ENCOUNTER — Encounter: Payer: PRIVATE HEALTH INSURANCE | Attending: Surgical | Primary: Internal Medicine

## 2021-12-20 IMAGING — US US RENAL RETRO COMPLETE
1 series · 14 of 25 positions shown · non-contrast
Comparison: 09/29/2020.

Images Obtained from [facility]
HISTORY: 66-year-old female with cystic kidney disease.
TECHNIQUE: Renal ultrasound retroperitoneum complete. Using real-time and Doppler ultrasound, the retroperitoneum and kidneys were evaluated.

[Series 1: us renal retro complete · 14 of 34 slices shown]
[im 1/34]
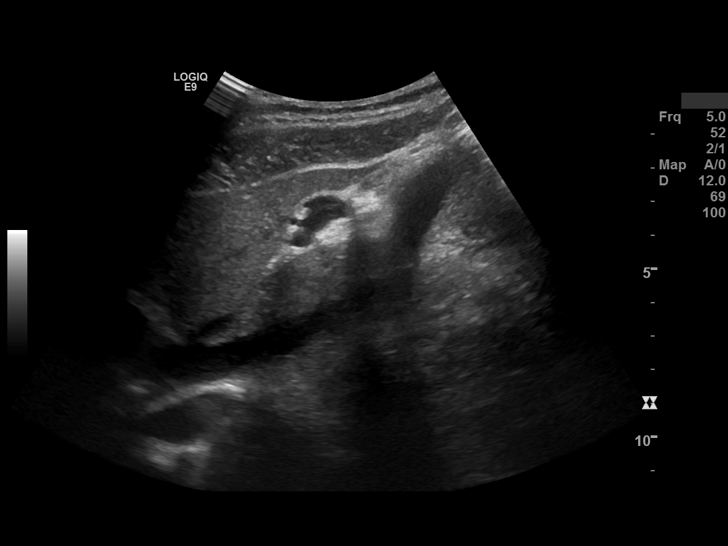
[im 3/34]
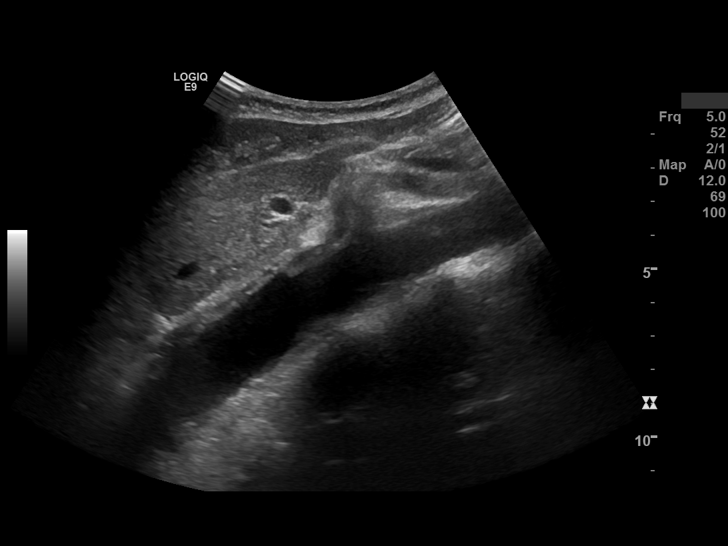
[im 6/34]
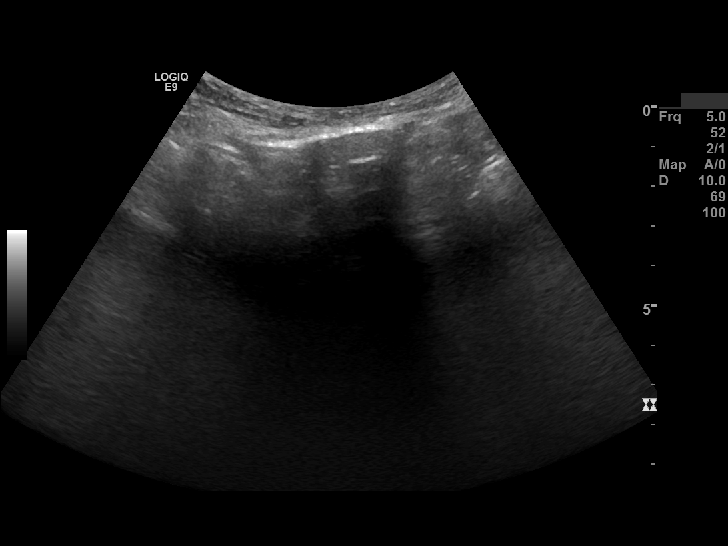
[im 9/34]
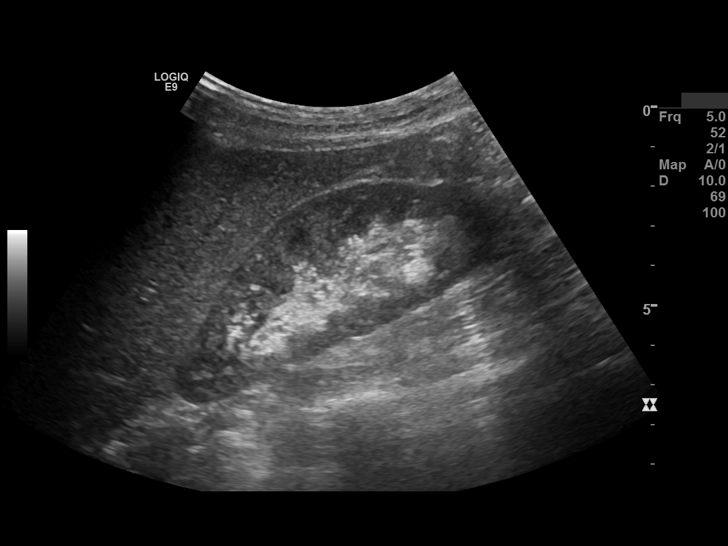
[im 12/34]
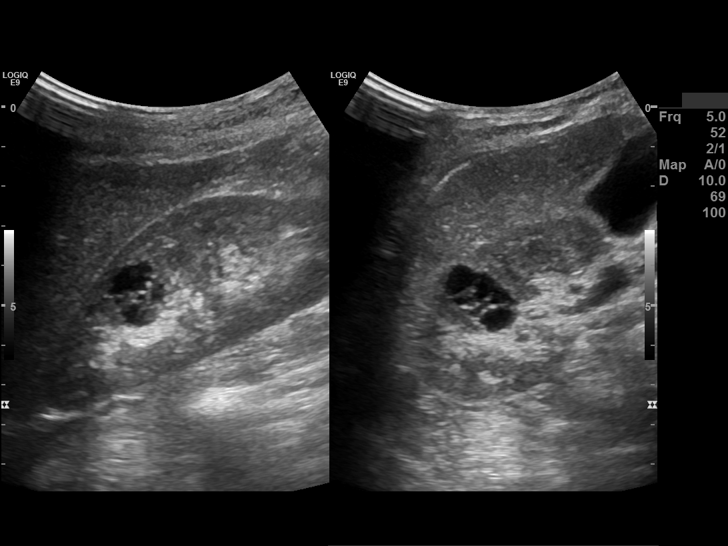
[im 13/34]
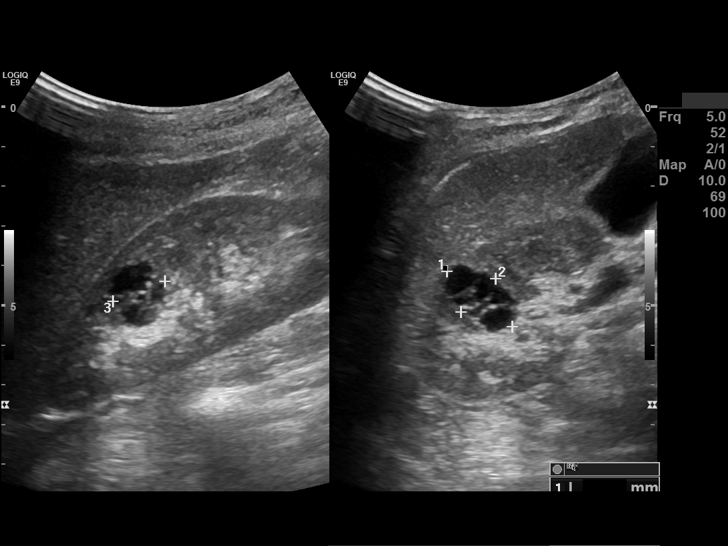
[im 16/34]
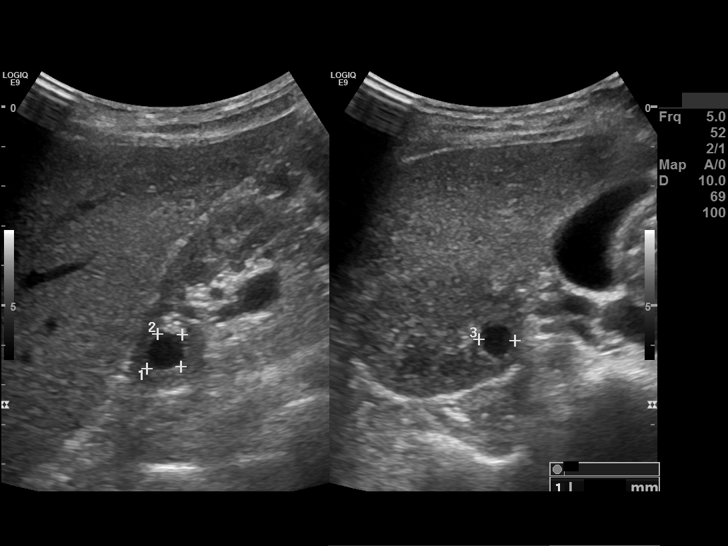
[im 18/34]
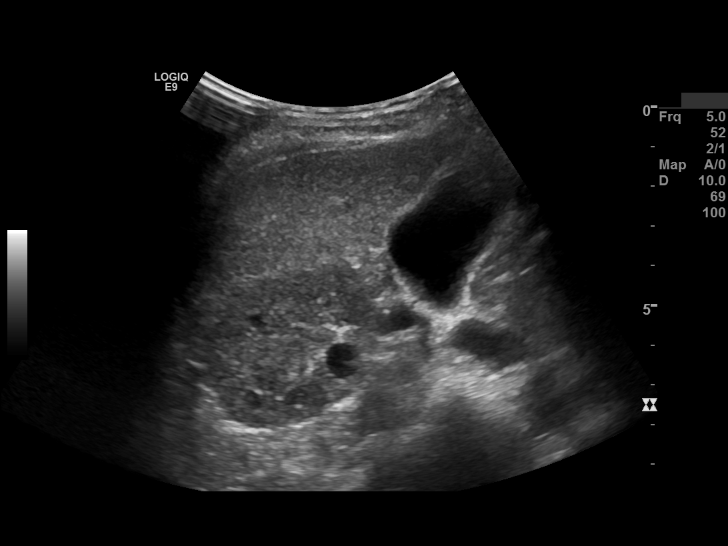
[im 21/34]
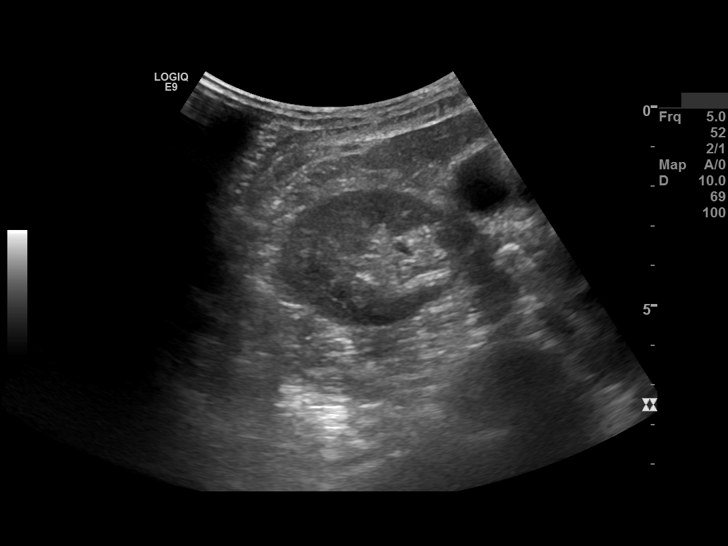
[im 23/34]
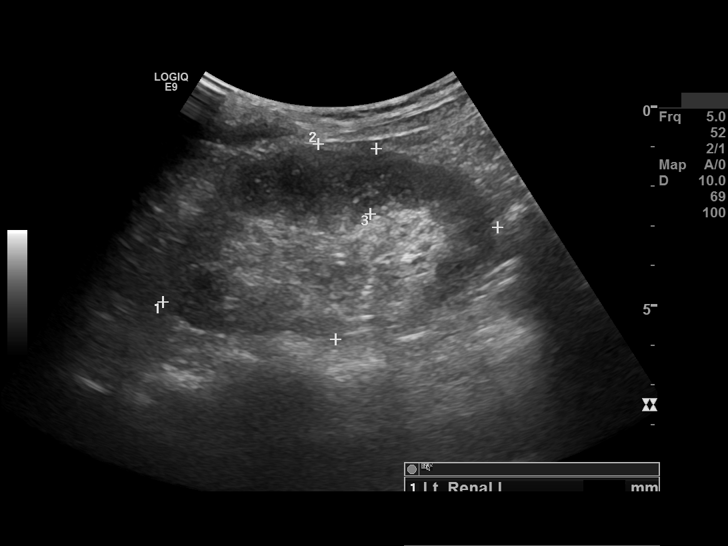
[im 25/34]
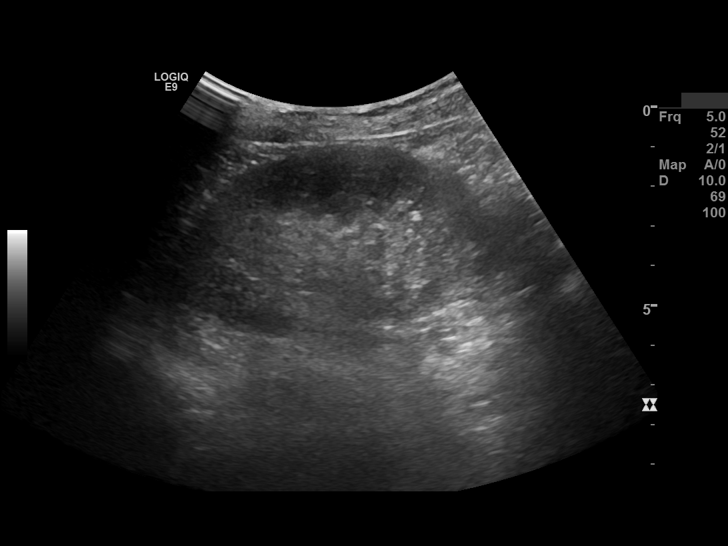
[im 28/34]
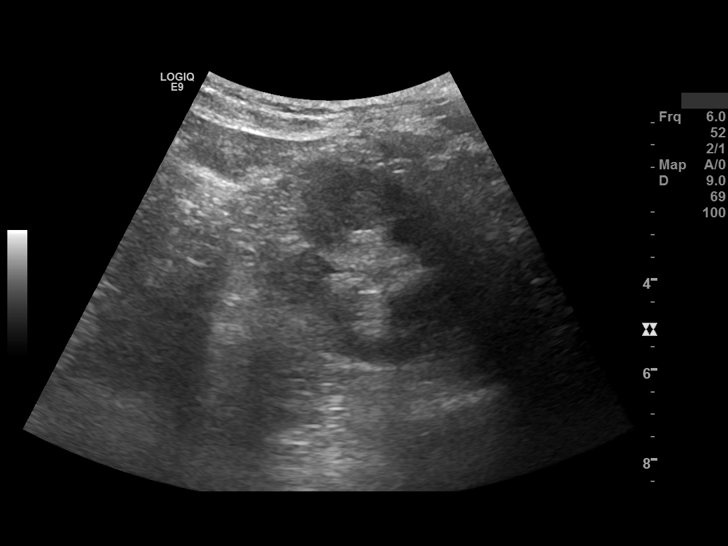
[im 31/34]
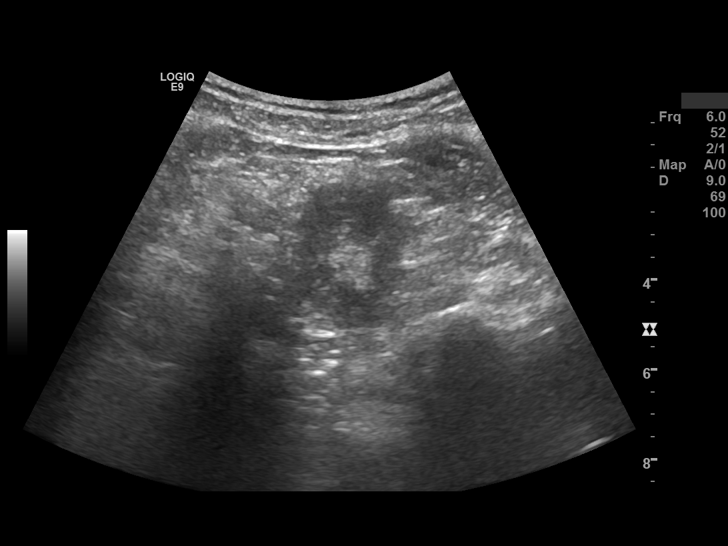
[im 34/34]
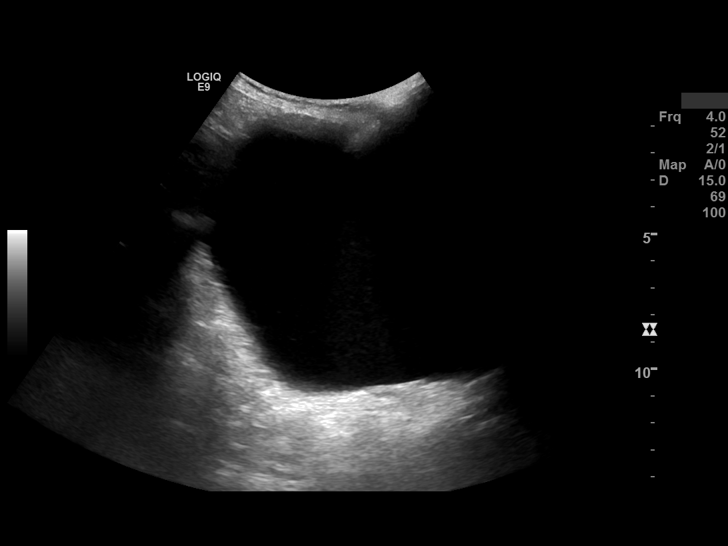

[14 of 25 positions shown; findings below may reference images not displayed]

FINDINGS: The right kidney measures 93 x 38 x 46 mm. The right renal cortex measures 13 mm. The right kidney volume measures 85.2 ml.
The left kidney measures 86 x 49 x 47 mm. The left renal cortex measures 16 mm. The left kidney volume measures 105.5 ml.
Renal Cyst: The right upper pole multiseptated cystic lesion is slightly larger than before, now 22 x 12 x 14 mm and has more septations and more "solid" components. MRI of the abdomen is needed to
evaluate this lesion, as it looks Bosniak III.
Renal Stone: None.
The bladder is overdistended.
Abdominal aorta: The visualized portion of the abdominal aorta is unremarkable.
Inferior vena cava: The visualized portion of the inferior vena cava is unremarkable.
Common iliac arteries/Bifurcation: Not seen
IMPRESSION: Slight increase in right upper pole Bosniak III looking cyst. MRI needed.

## 2021-12-26 ENCOUNTER — Encounter: Payer: PRIVATE HEALTH INSURANCE | Attending: Surgical | Primary: Internal Medicine

## 2022-01-15 IMAGING — MR MRI ABDOMEN WO/W CONTRAST
11 series · 48 of 48 positions shown · IV contrast (15CC PROHANCE)
Comparison: Renal ultrasound 12/20/2021, 09/29/2020 and previous CT chest examinations most recently 04/27/2021

Images Obtained from [facility]
HISTORY: Cystic kidney disease, unspecified
TECHNIQUE: Multiplanar multisequence MRI of the abdomen without and with contrast was performed.
Contrast dose: 15 mL ProHance

[Series 2: t2_haste_cor · coronal · 5.0mm · 1.07mm/px · 2 of 28 slices shown]
[im 1/28]
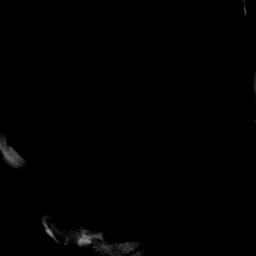
[im 28/28]
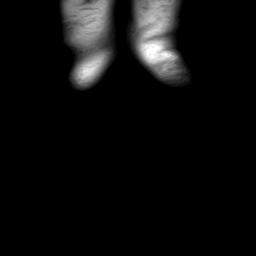

[Series 3: t2_(person_name)_(person_name) · axial · 4.0mm · 0.79mm/px · z∈[-119,+45]mm · 2 of 34 slices shown]
[im 1/34]
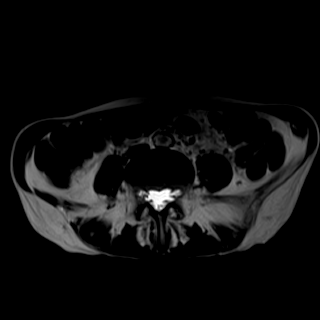
[im 34/34]
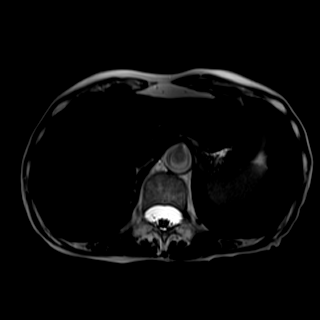

[Series 4: t1_vibe_(person_name)_pre_opp · axial · 3.0mm · 1.00mm/px · z∈[-112,+40]mm · 4 of 52 slices shown]
[im 1/52]
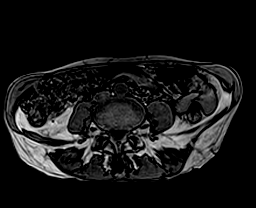
[im 18/52]
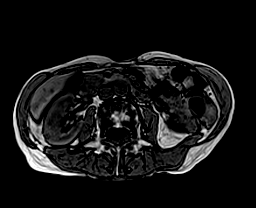
[im 35/52]
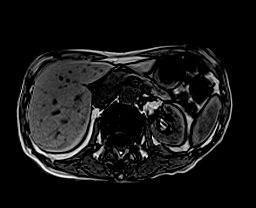
[im 52/52]
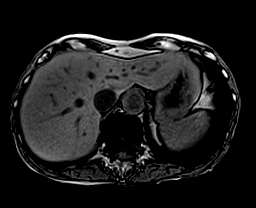

[Series 5: t1_vibe_(person_name)_pre_in · axial · 3.0mm · 1.00mm/px · z∈[-112,+40]mm · 5 of 52 slices shown]
[im 1/52]
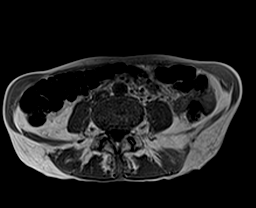
[im 13/52]
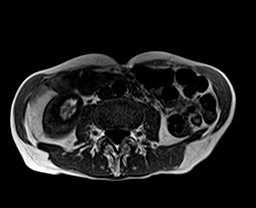
[im 26/52]
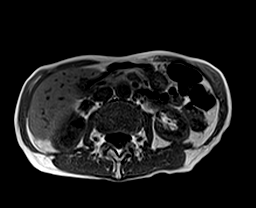
[im 39/52]
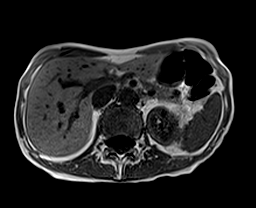
[im 52/52]
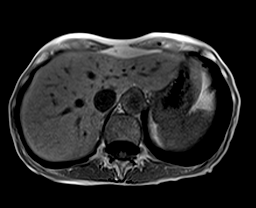

[Series 14: t1_vibe_(person_name)_(person_name)+(person_name)1_w · axial · 3.0mm · 0.94mm/px · z∈[-113,+40]mm · 5 of 52 slices shown]
[im 1/52]
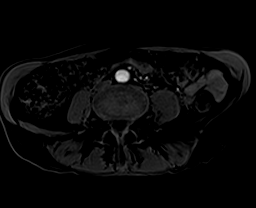
[im 13/52]
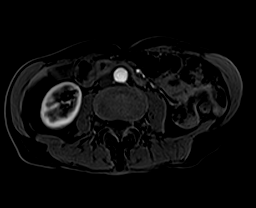
[im 26/52]
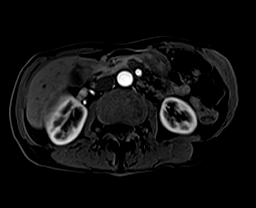
[im 39/52]
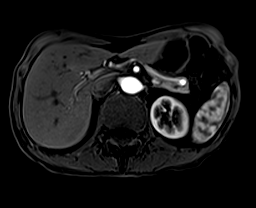
[im 52/52]
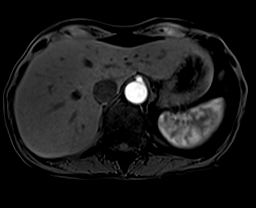

[Series 15: t1_vibe_(person_name)_(person_name)+(person_name)1_w_sub · axial · 3.0mm · 0.94mm/px · z∈[-113,+40]mm · 5 of 52 slices shown]
[im 1/52]
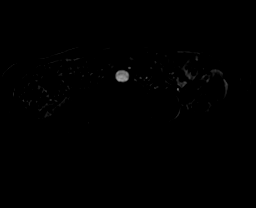
[im 13/52]
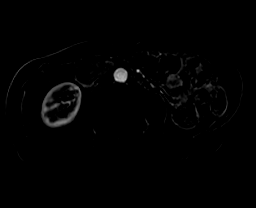
[im 26/52]
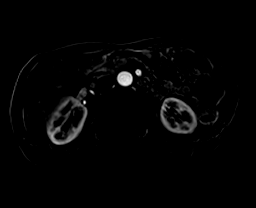
[im 39/52]
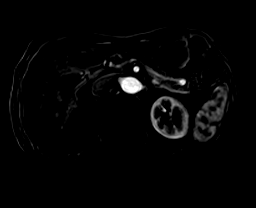
[im 52/52]
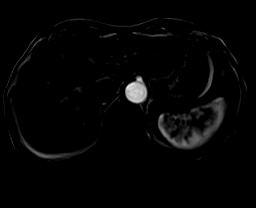

[Series 22: t1_vibe_(person_name)_(person_name)+(person_name)2_w · axial · 3.0mm · 0.94mm/px · z∈[-113,+40]mm · 5 of 52 slices shown]
[im 1/52]
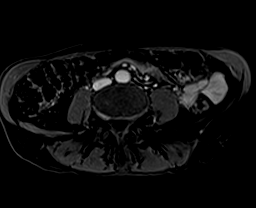
[im 13/52]
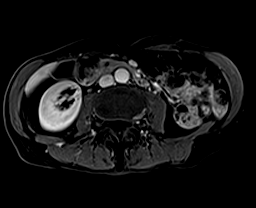
[im 26/52]
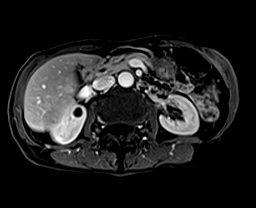
[im 39/52]
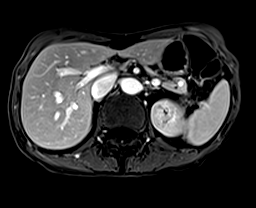
[im 52/52]
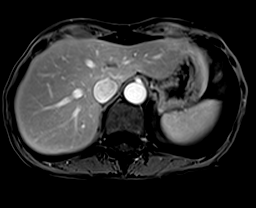

[Series 23: t1_vibe_(person_name)_(person_name)+(person_name)2_w_sub · axial · 3.0mm · 0.94mm/px · z∈[-113,+40]mm · 5 of 52 slices shown]
[im 1/52]
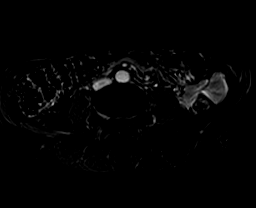
[im 13/52]
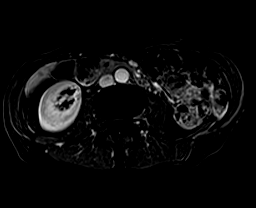
[im 26/52]
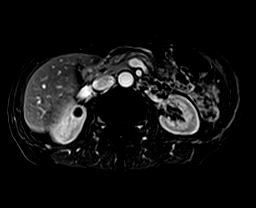
[im 39/52]
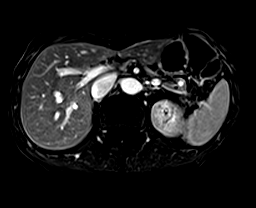
[im 52/52]
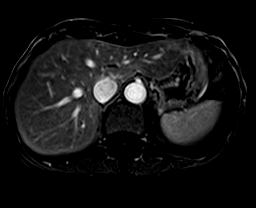

[Series 27: t1_vibe_cor_fs+c_w · coronal · 2.5mm · 0.90mm/px · 5 of 52 slices shown]
[im 1/52]
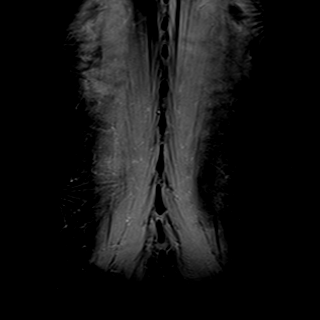
[im 13/52]
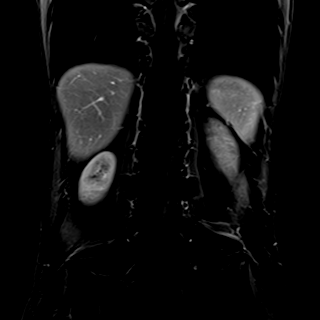
[im 26/52]
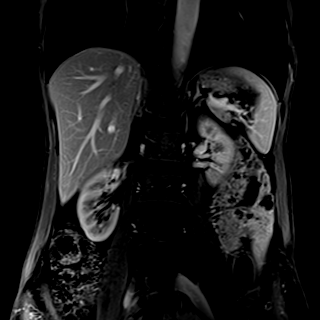
[im 39/52]
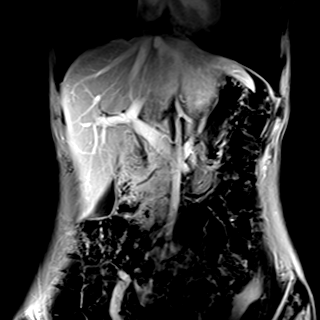
[im 52/52]
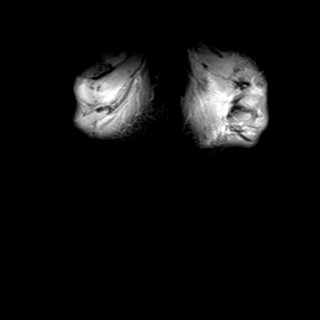

[Series 34: t1_vibe_(person_name)_(person_name)+(person_name)3_w · axial · 3.0mm · 0.94mm/px · z∈[-113,+40]mm · 5 of 52 slices shown]
[im 1/52]
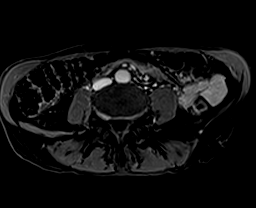
[im 13/52]
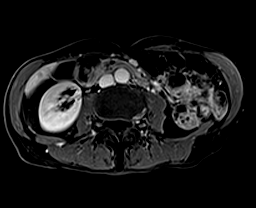
[im 26/52]
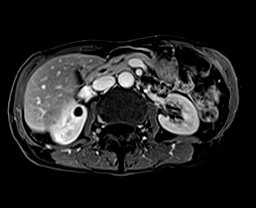
[im 39/52]
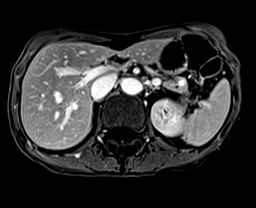
[im 52/52]
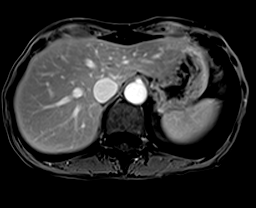

[Series 35: t1_vibe_(person_name)_(person_name)+(person_name)3_w_sub · axial · 3.0mm · 0.94mm/px · z∈[-113,+40]mm · 5 of 52 slices shown]
[im 1/52]
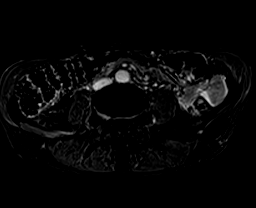
[im 13/52]
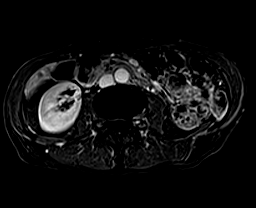
[im 26/52]
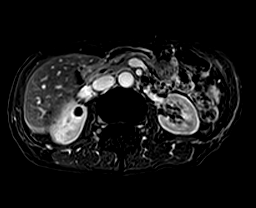
[im 39/52]
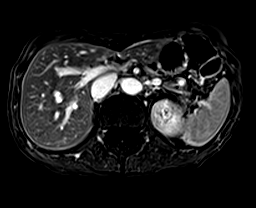
[im 52/52]
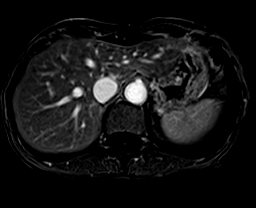

[48 of 48 positions shown; findings below may reference images not displayed]

FINDINGS: In the lateral aspect of the right mid kidney, there is a T2 bright 2.1 x 1.3 cm cyst corresponding to the complex cyst seen on ultrasound. It contains several very thin (less than 2 mm) equivocally
enhancing septae best seen on postcontrast sequences. No septal thickening or enhancing nodularity is seen. This is most consistent with a Bosniak category II cyst.
There is an additional 0.8 cm right upper pole cyst.
The liver has a homogeneous signal and enhancement without significant hepatic steatosis. The gallbladder is unremarkable. Incidental note is made of a pancreas divisum.
The spleen and adrenal glands are unremarkable. Bowel loops have a normal caliber. There is copious stool seen in the visualized colon. Abdominal aorta has a normal caliber. No adenopathy is seen. No
worrisome marrow replacing lesions are seen.
IMPRESSION: 1.  2.1 x 1.3 cm Bosniak category II cyst in the right mid kidney corresponds to finding on ultrasound.
2.  Additional small right upper pole renal cyst. No worrisome enhancing renal lesion seen.
3.  Copious stool in the colon may indicate constipation. Correlate clinically.
4.  Pancreas divisum.

## 2022-07-30 IMAGING — US US RENAL RETRO COMPLETE
1 series · 14 of 25 positions shown · non-contrast
Comparison: 12/20/21 ultrasound and 01/15/22 MRI

Images Obtained from [facility]
HISTORY: 67 years-old Female with Cystic kidney disease, unspecified.
TECHNIQUE: Renal ultrasound retroperitoneum complete. Using real-time and color Doppler ultrasound, the retroperitoneum and kidneys were evaluated.

[Series 1: us renal retro complete · 14 of 33 slices shown]
[im 1/33]
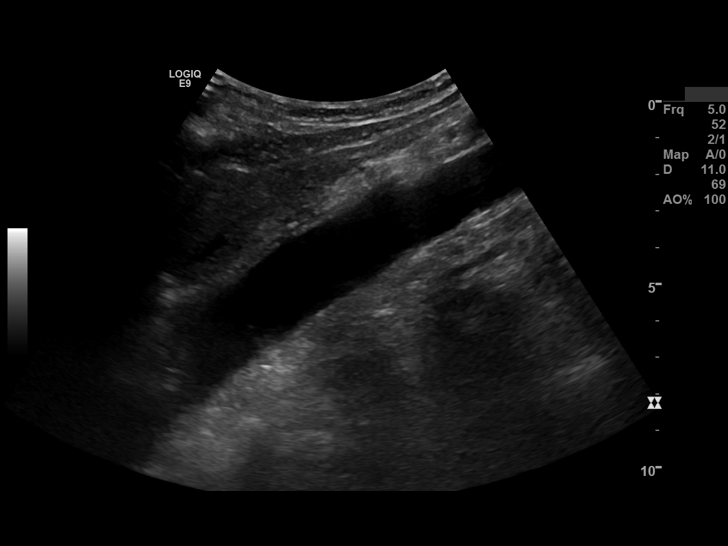
[im 3/33]
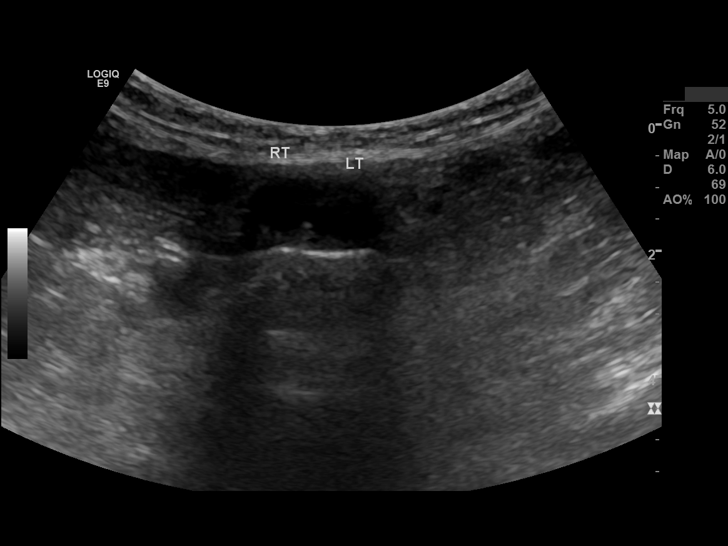
[im 6/33]
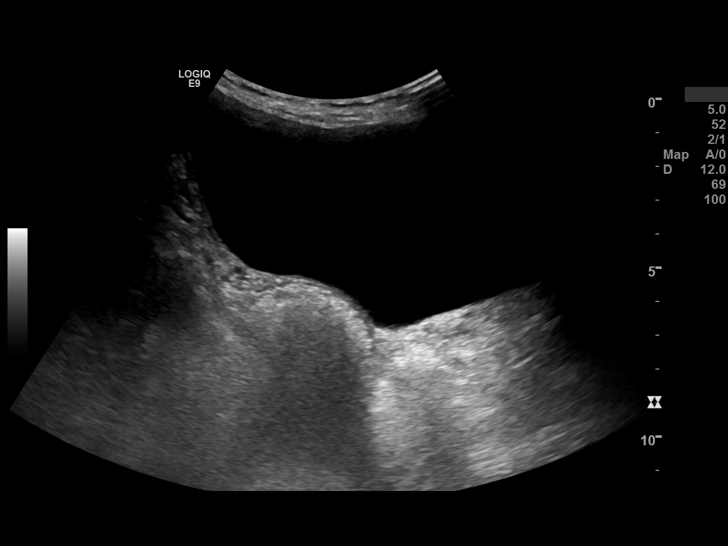
[im 9/33]
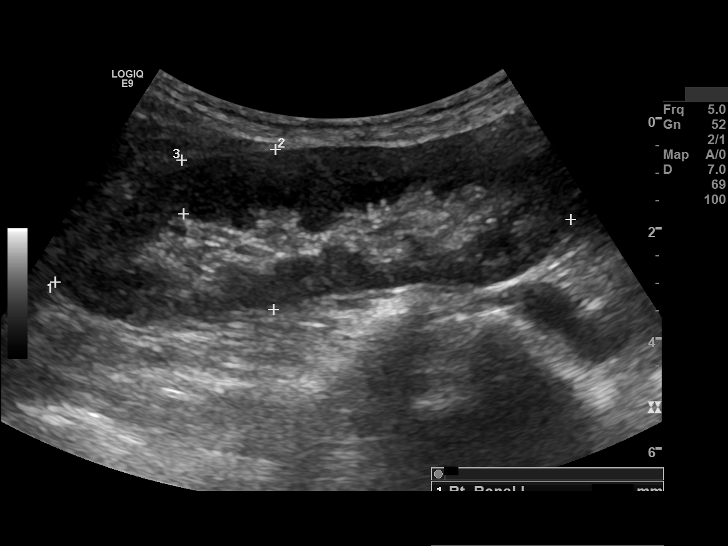
[im 11/33]
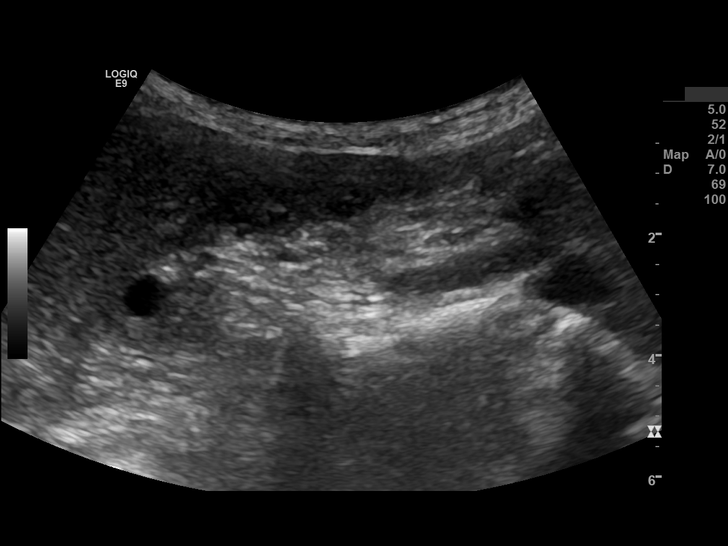
[im 13/33]
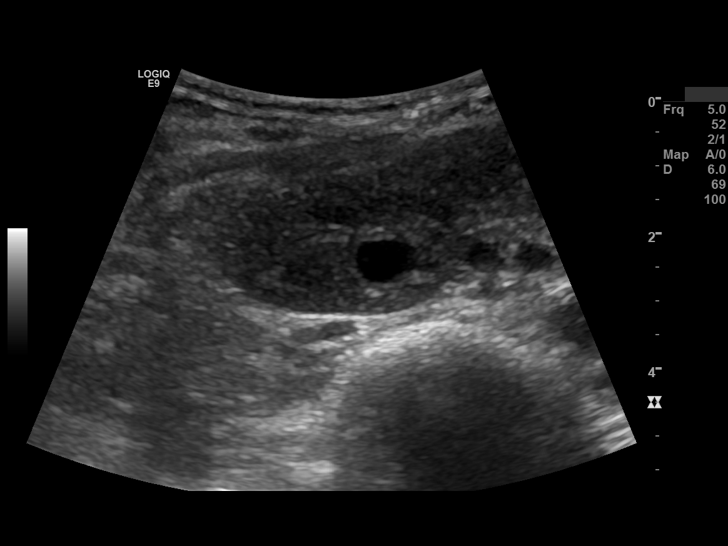
[im 15/33]
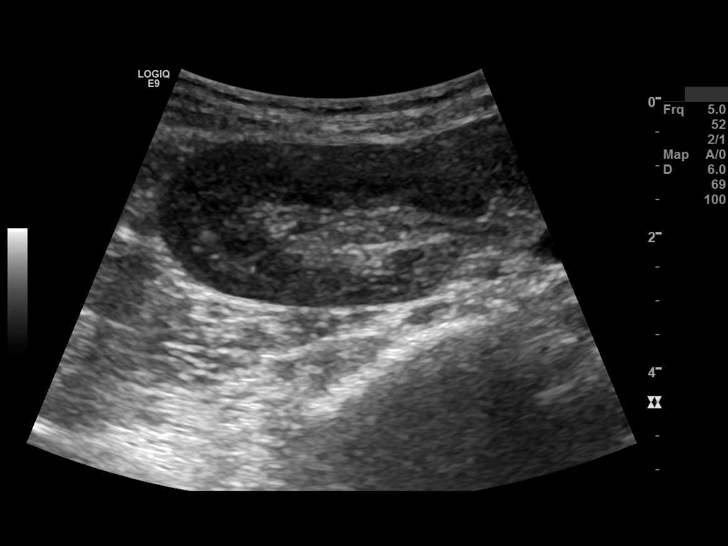
[im 18/33]
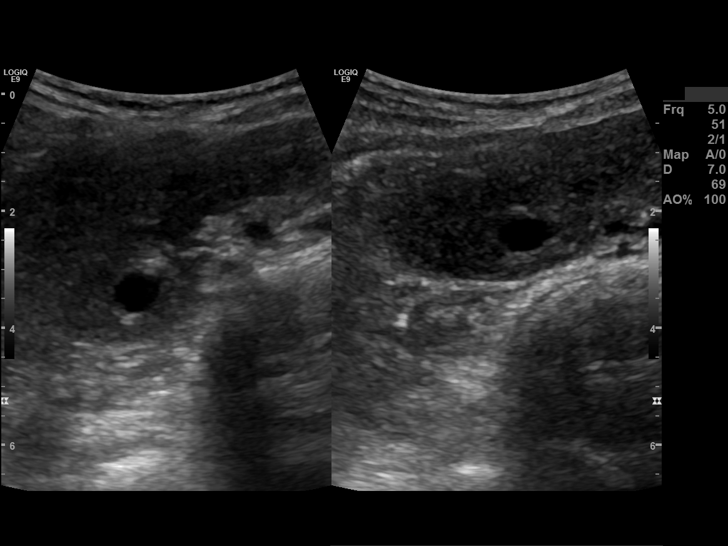
[im 21/33]
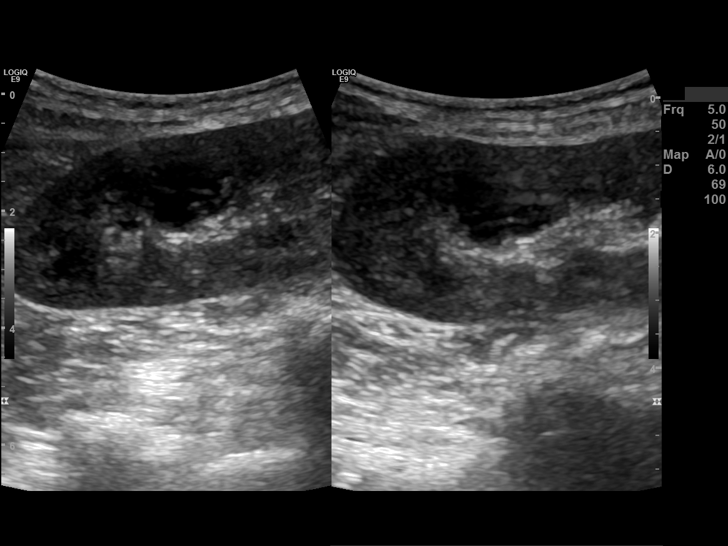
[im 22/33]
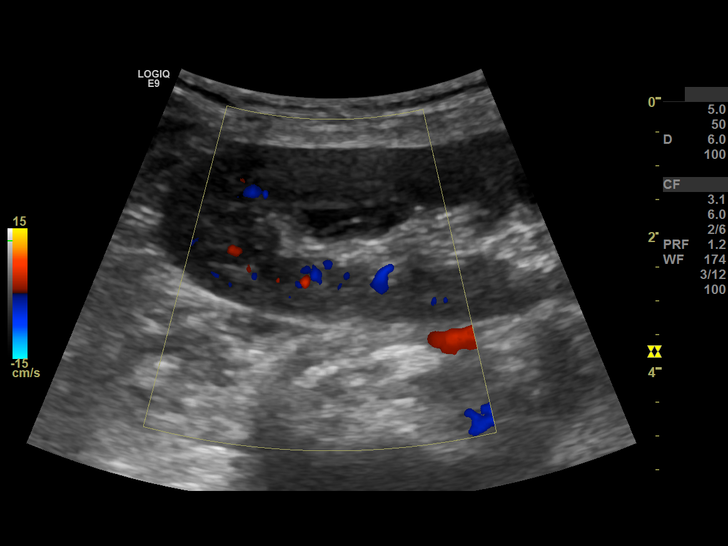
[im 25/33]
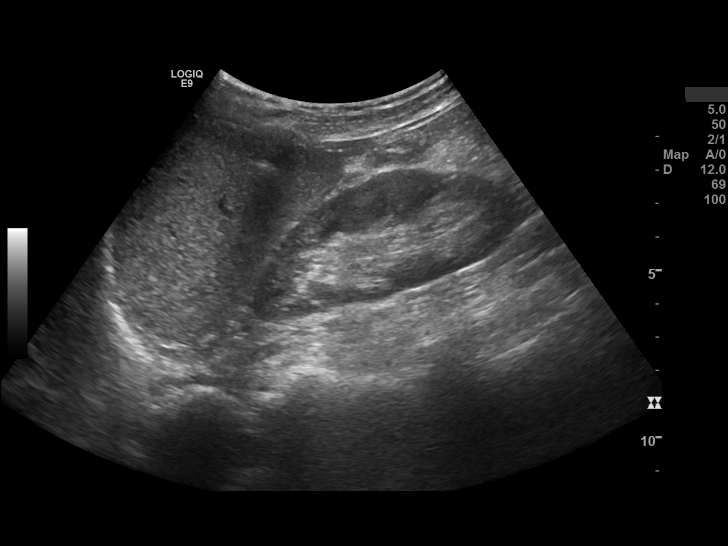
[im 27/33]
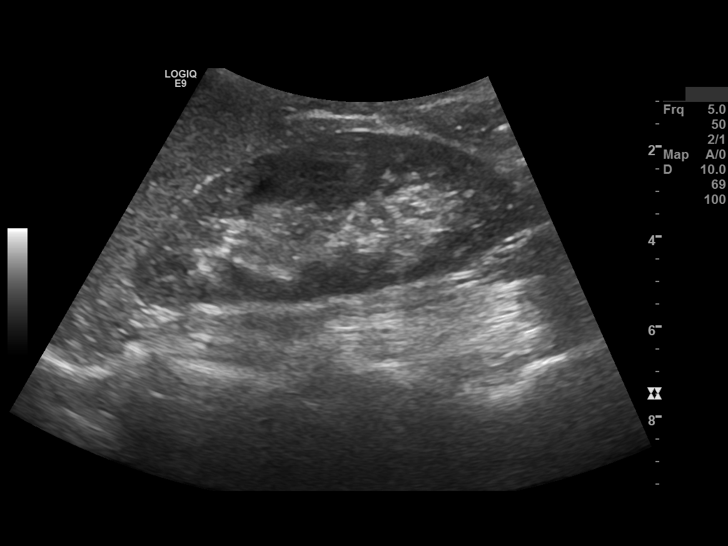
[im 30/33]
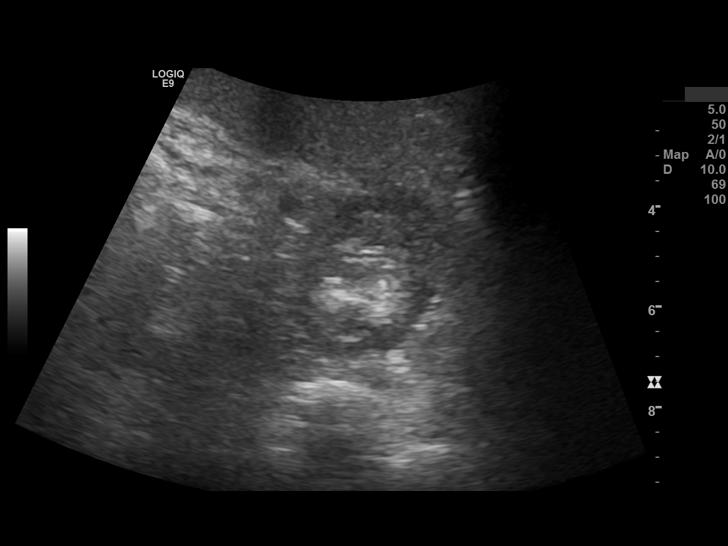
[im 33/33]
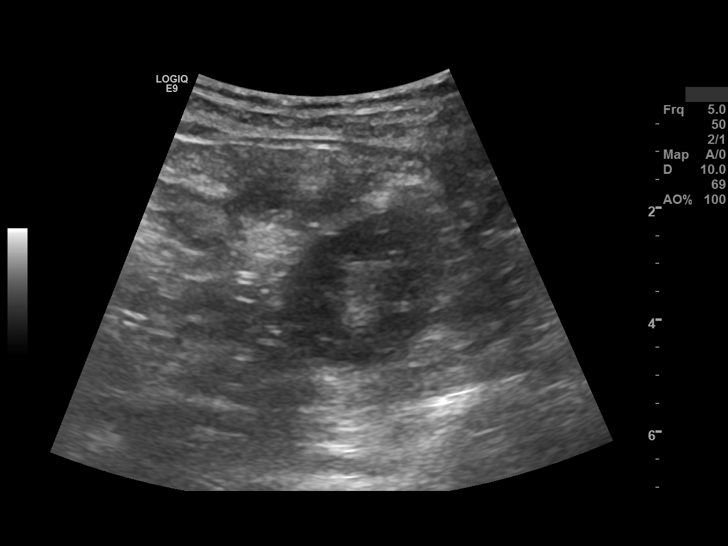

[14 of 25 positions shown; findings below may reference images not displayed]

FINDINGS: The right kidney measures 94 x 29 x 24 mm. The right renal cortex measures 10 mm. The right kidney volume measures 35.1 ml.
The left kidney measures 91 x 36 x 41 mm. The left renal cortex measures 12 mm. The left kidney volume measures 71.7 ml.
Renal Cyst: Right upper pole 20 x 13 x 19 mm cystic lesion that is either multiple thin internal septations or multiple conglomerated cysts. Either way, my feeling is this is a Bosniak 2 lesion. 9 mm
right upper pole Bosniak 1 cyst.
Renal Stone: None.
The bladder is unremarkable.
Abdominal aorta: The visualized portion of the abdominal aorta is unremarkable.
Inferior vena cava: The visualized portion of the inferior vena cava is unremarkable.
Common iliac arteries/Bifurcation: The visualized portion is unremarkable
IMPRESSION: Right Bosniak 1 and 2 cysts. No suspicious lesion.

## 2022-08-03 IMAGING — CT CT THORAX WO CONTRAST
2 of 4 series · 11 of 36 positions shown, 13 images · non-contrast
Comparison: CT angiogram of chest, 04/17/21
Coronary Artery Calcification: Absent

Images Obtained from [facility]
HISTORY: Persistent bacterial lung infection
TECHNIQUE: Scans of the chest are performed at 5mm increments with 2 mm lung reconstructions and coronal and sagittal reconstructions without contrast.
Siemens nodule finding program was used to assist pulmonary nodule detection.
Dose reduction technique used: Automated exposure control and adjustment of the mA and/or kV according to patient size. CT Studies and Cardiac Nuclear Medicine Studies in last 12-months = 0
Total radiation dose to patient is CTDIvol 4.24 mGy and DLP 147.00 mGy-cm.

[Series 2: soft tissue · axial · 0.41mm/px · z∈[+1373,+1633]mm · 8 of 62 slices shown, 10 images]
[im 5/62  mediastinal]
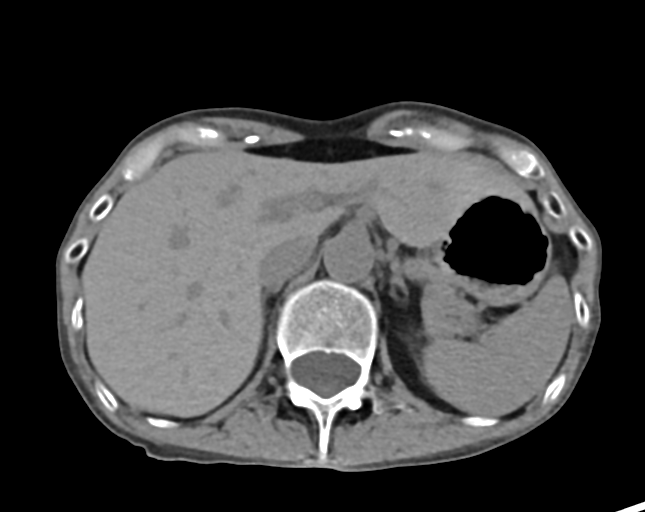
[im 5/62  lung]
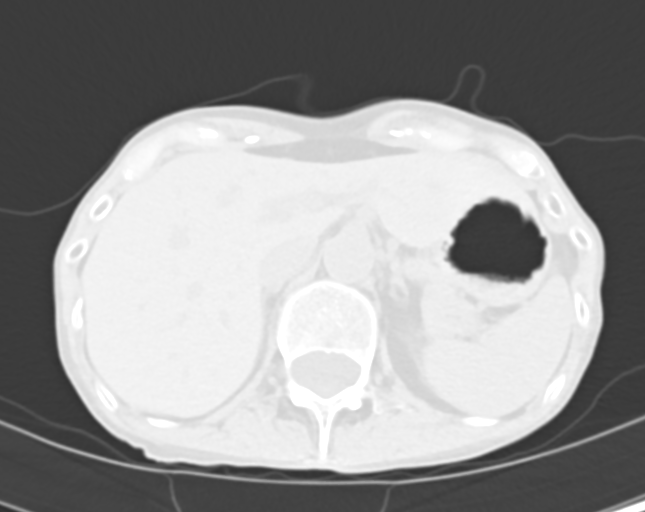
[im 13/62  lung]
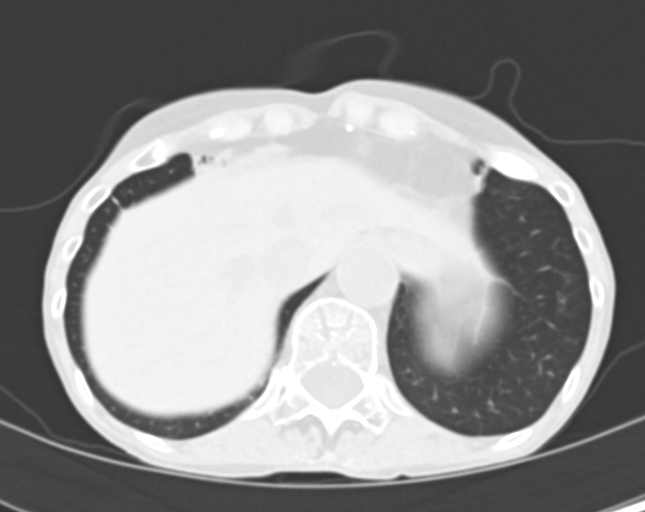
[im 20/62  lung]
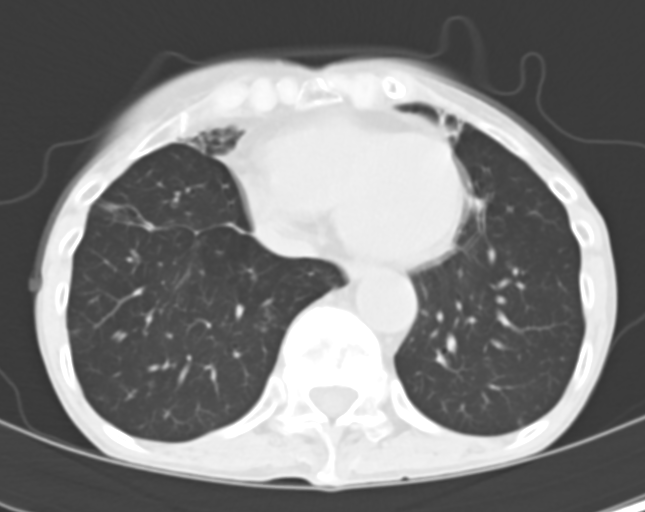
[im 27/62  lung]
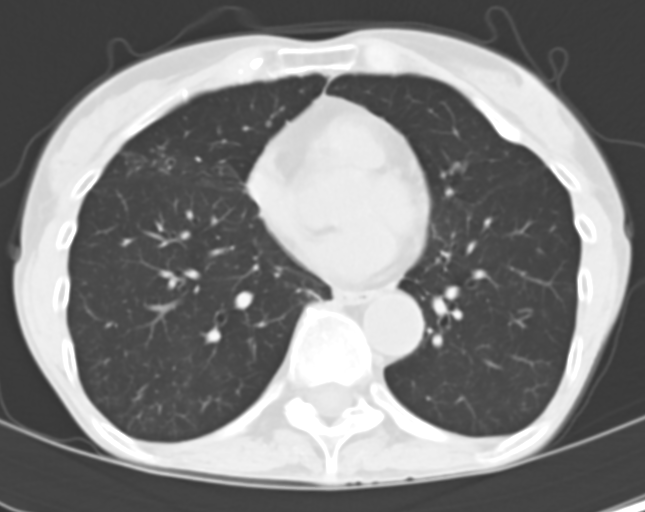
[im 35/62  mediastinal]
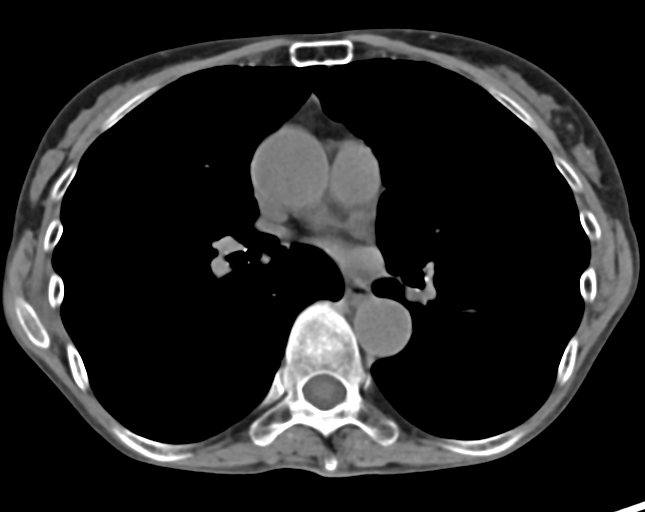
[im 35/62  lung]
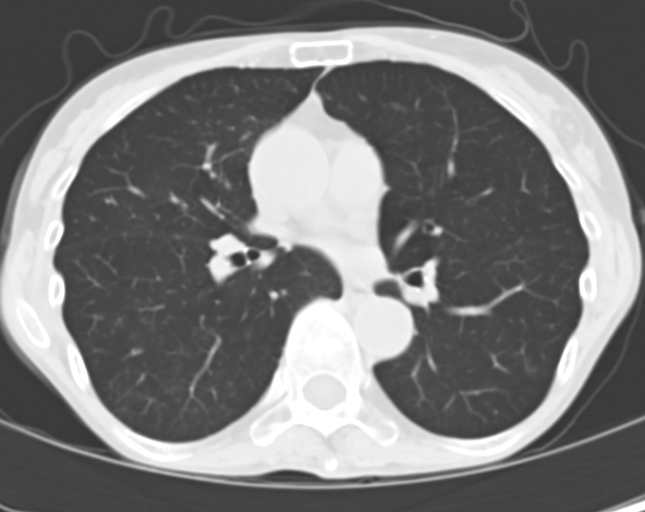
[im 42/62  lung]
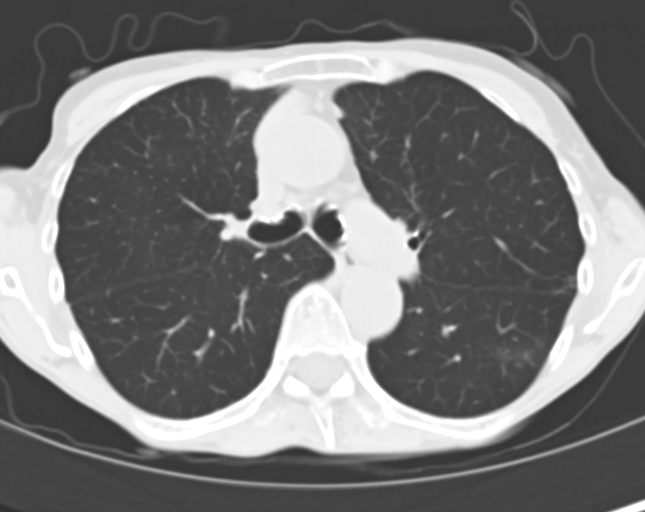
[im 49/62  lung]
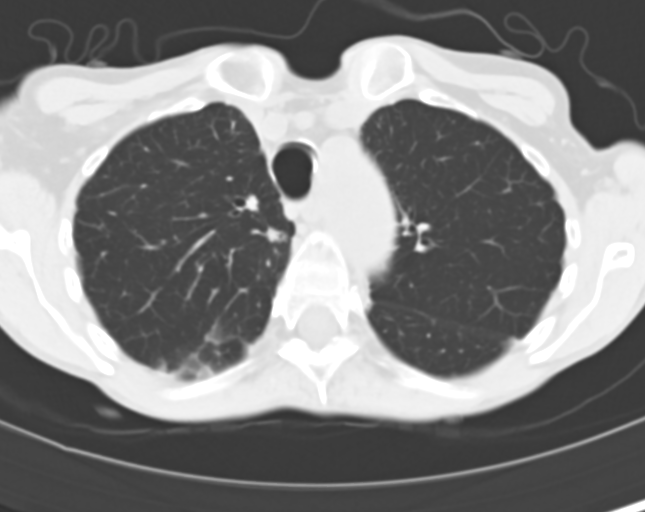
[im 57/62  lung]
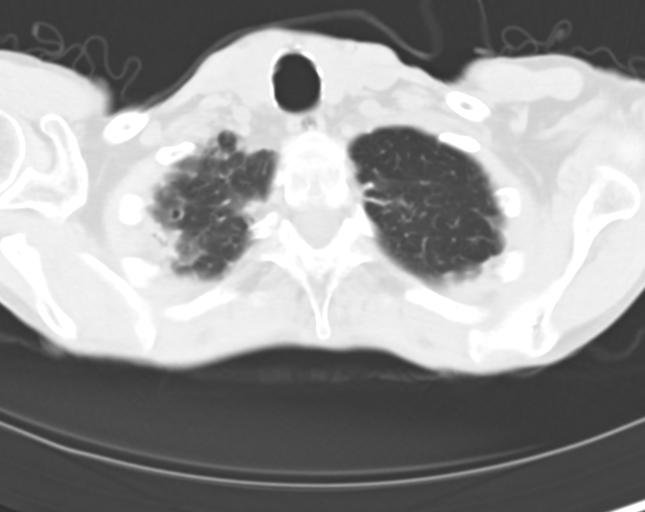

[Series 4: coronal · coronal · 0.51mm/px · 3 of 41 slices shown]
[im 9/41  lung]
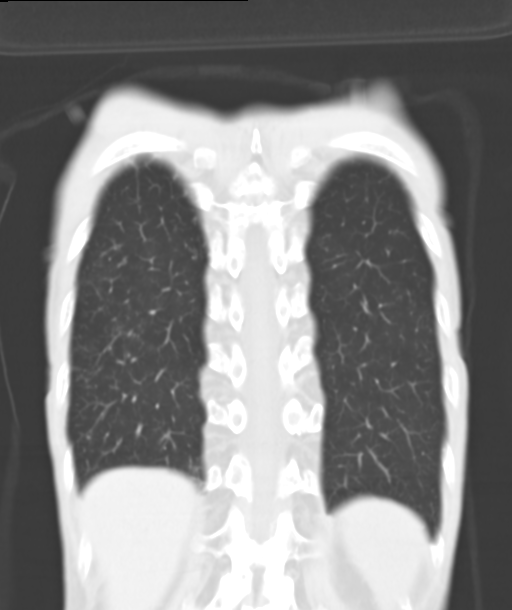
[im 17/41  lung]
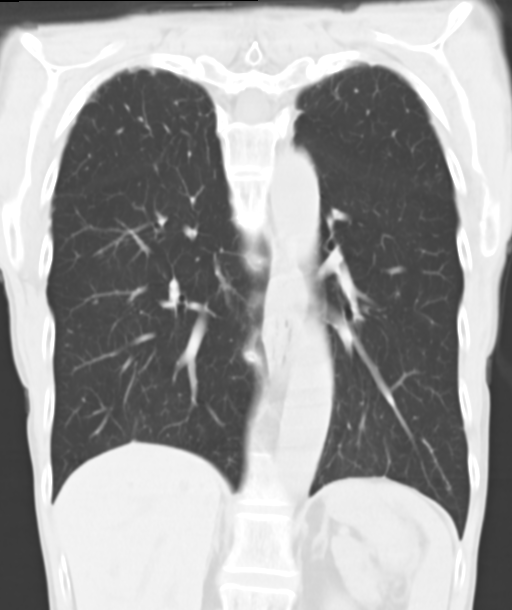
[im 25/41  lung]
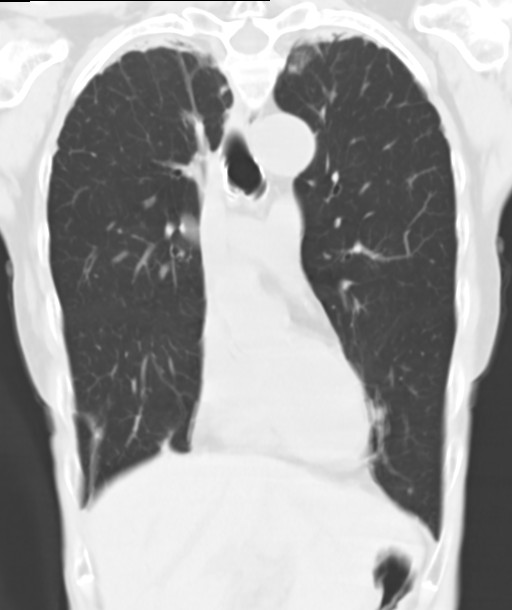

[11 of 36 positions shown; findings below may reference images not displayed]

FINDINGS: The chest wall and axillary regions are normal. Thoracic inlet is normal. No mediastinal lymphadenopathy or mass is seen. Heart size is normal. No hiatal hernia is present.
Pulmonary hyperexpansion consistent with COPD is seen with stable biapical fibropleural thickenings and stable minimal nodular lung disease, postinfectious in origin with associated fluid in
subsegmental bronchi, predominantly localized to the medial segment right middle lobe and lingular segment left upper lobe. There is a new minimal groundglass pulmonary opacity in the superior
segment of right and left lower lobe ([DATE];[DATE]) with minimal tree-in-bud infiltrate, with interval resolution of tree-in-bud infiltrate which was present in the posterior aspect of the left upper
lobe compared to 04/17/21 exam. No additional findings are present.
Coronal and sagittal reconstructions show no additional findings. Other than for interval development of a nonacute 20% fracture involving the superior endplate of T11 vertebra which was not present
on 04/27/21. Scans which include upper abdomen show no additional findings.
IMPRESSION: 1.  COPD with stable biapical fibropleural thickenings and stable minimal nodular lung disease, postinfectious in origin, predominantly localized to the medial segment right middle lobe and lingular
segment left upper lobe.
2.  New minimal groundglass pulmonary opacity in the superior segment of right and left lower lobe with minimal tree-in-bud infiltrate, with interval resolution of tree-in-bud infiltrate in the
posterior aspect of left upper lobe compared to 04/17/21 exam. Findings are consistent with waxing and waning atypical pulmonary infection and should be correlated for mycobacterium avium
intracellulare.
3.  Interval development of nonacute 20% fracture involving the superior endplate of T11 vertebra since CTA 23.

## 2022-09-01 IMAGING — MR MRI TSPINE WO CONTRAST
5 series · 41 of 48 positions shown · IV contrast (gadolinium)
Comparison: CT chest, 08/03/22

Images Obtained from [facility]
HISTORY: Other osteoporosis with current pathological fracture, vertebra(e), initial encounter for fracture, Spinal stenosis, cervical region
TECHNIQUE: Scans of the thoracic spine are performed in sagittal and transaxial planes using T1, fast spin-echo T2 and STIR sequences without gadolinium.

[Series 17: t2_sag · sagittal · 3.0mm · 0.83mm/px · 7 of 18 slices shown]
[im 1/18]
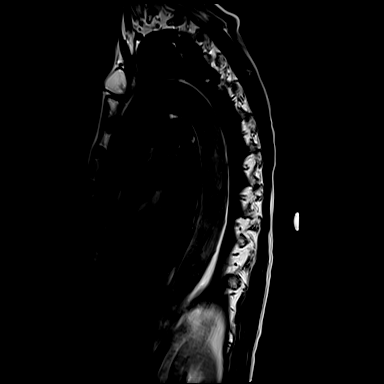
[im 3/18]
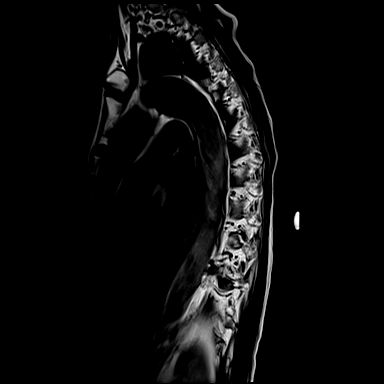
[im 6/18]
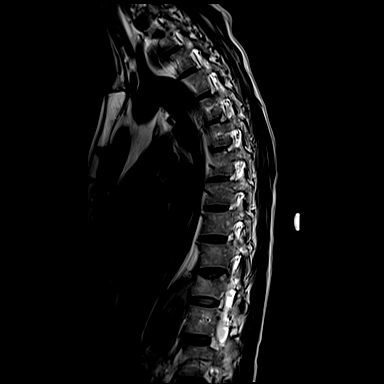
[im 9/18]
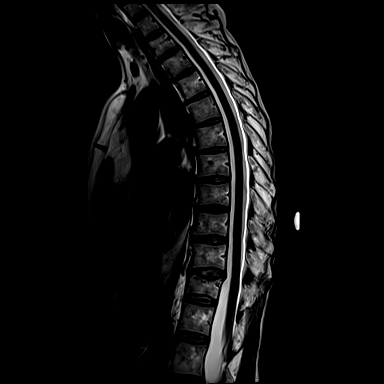
[im 12/18]
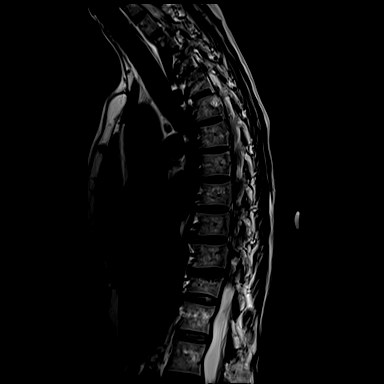
[im 15/18]
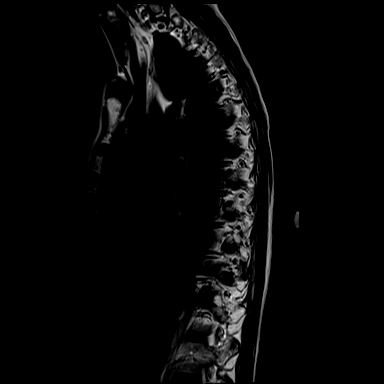
[im 18/18]
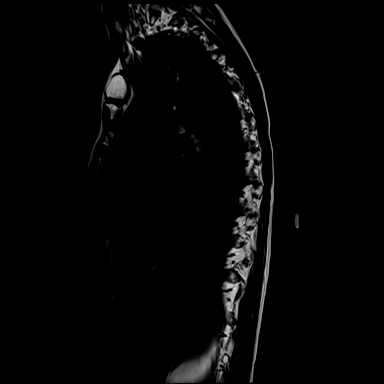

[Series 18: t1_sag · sagittal · 3.0mm · 0.83mm/px · 7 of 18 slices shown]
[im 1/18]
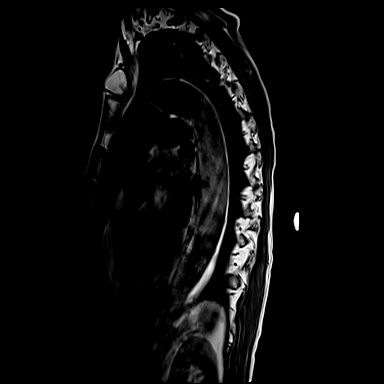
[im 3/18]
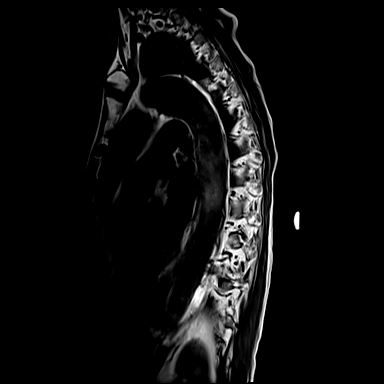
[im 6/18]
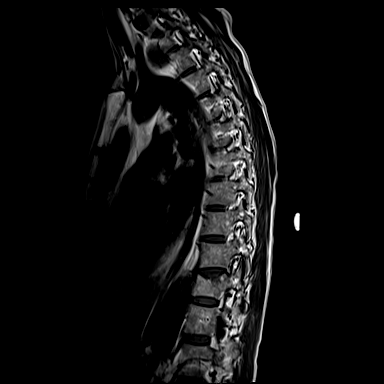
[im 9/18]
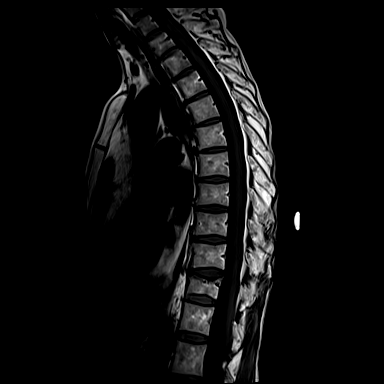
[im 12/18]
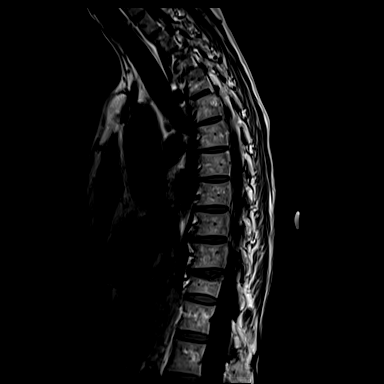
[im 15/18]
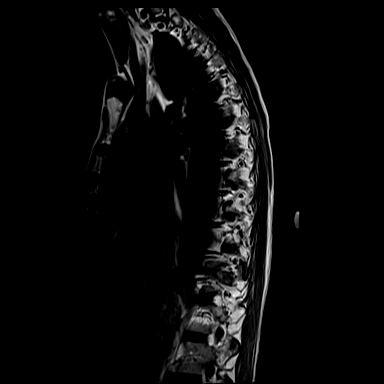
[im 18/18]
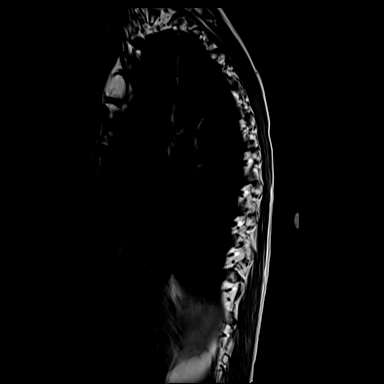

[Series 19: ir_sag · sagittal · 3.0mm · 1.25mm/px · 7 of 18 slices shown]
[im 1/18]
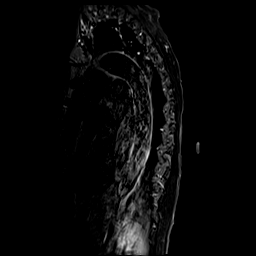
[im 3/18]
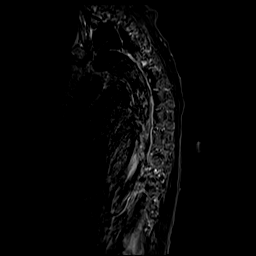
[im 6/18]
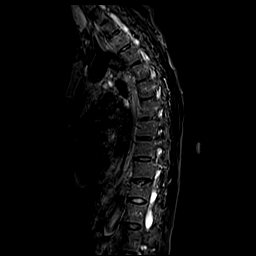
[im 9/18]
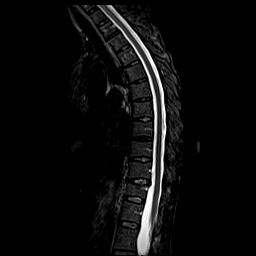
[im 12/18]
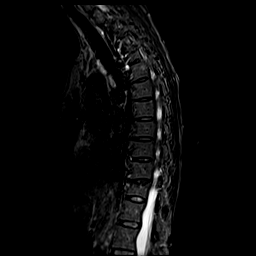
[im 15/18]
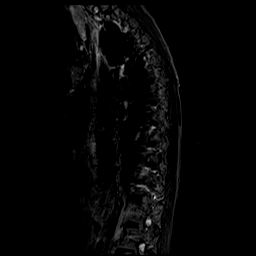
[im 18/18]
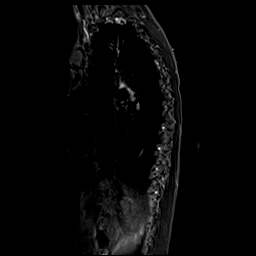

[Series 20: t2_axial · axial · 3.5mm · 0.56mm/px · z∈[-216,-72]mm · 12 of 34 slices shown (1 of 2)]
[im 1/34]
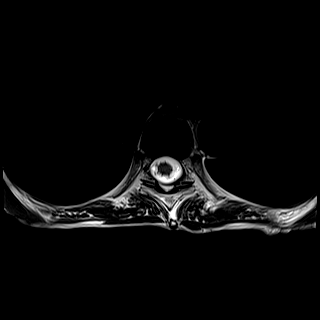
[im 3/34]
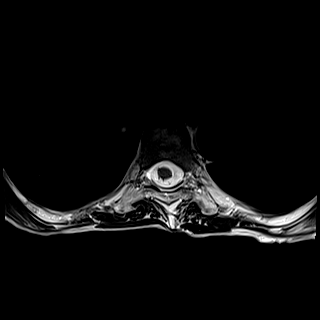
[im 6/34]
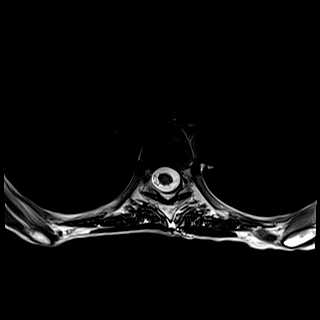
[im 9/34]
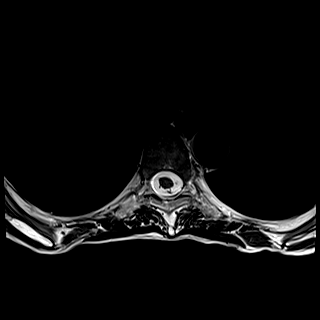
[im 12/34]
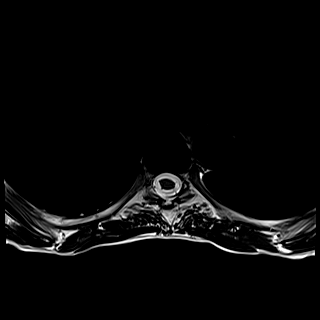
[im 14/34]
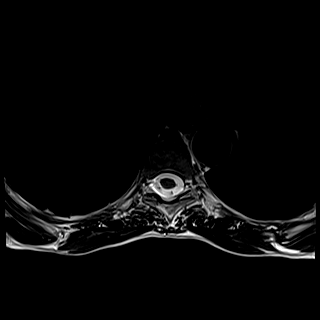
[im 17/34]
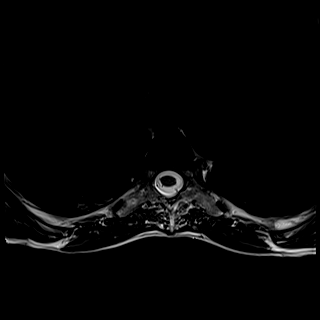
[im 20/34]
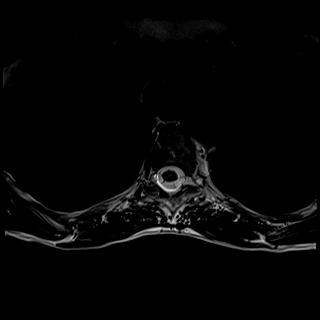
[im 23/34]
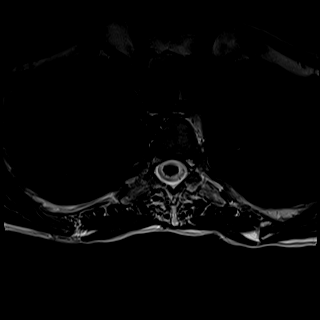
[im 25/34]
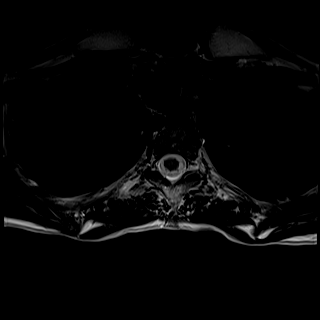
[im 28/34]
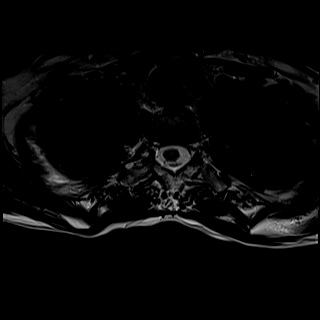
[im 34/34]
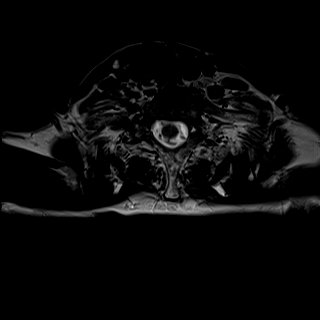

[Series 21: t2_axial · axial · 3.5mm · 0.56mm/px · z∈[-333,-175]mm · 8 of 36 slices shown (2 of 2)]
[im 1/36]
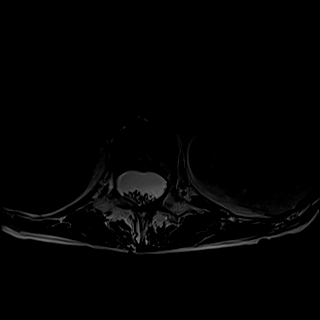
[im 6/36]
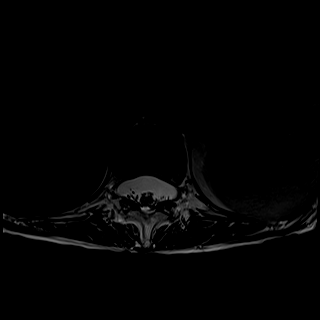
[im 11/36]
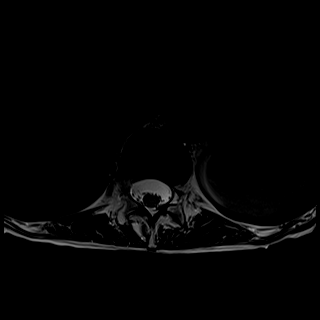
[im 17/36]
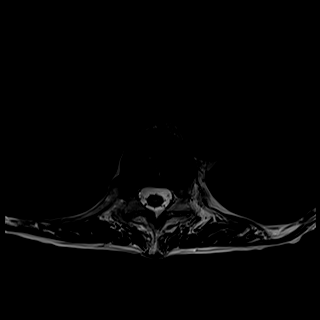
[im 19/36]
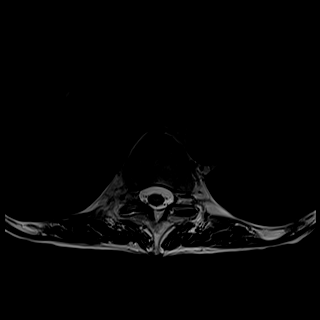
[im 25/36]
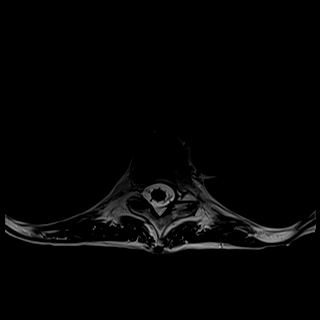
[im 30/36]
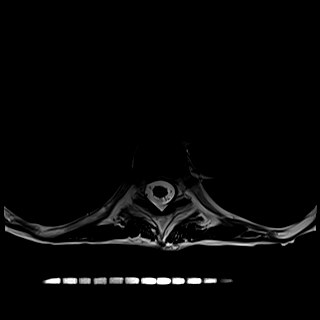
[im 36/36]
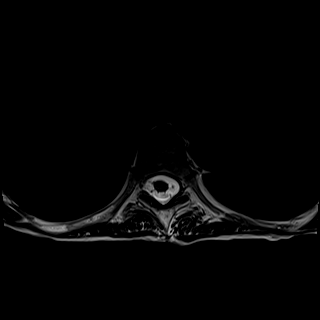

[41 of 48 positions shown; findings below may reference images not displayed]

FINDINGS: Thoracic vertebral column is normal except for a nonacute 20% superior endplate T11 compression fracture which was also noted on recent CT chest of 08/03/22.  The alignment is anatomic.
Disc heights are well-maintained. The caudal margin of spinal cord is normal terminating at the mid body of L1.
C7-T1, there is no significant disc herniation, central spinal canal stenosis or neural foraminal narrowing.
T1-2,  there is no significant disc herniation, central spinal canal stenosis or neural foraminal narrowing.
T2-3, there is no significant disc herniation, central spinal canal stenosis or neural foraminal narrowing.
T3-4, there is no significant disc herniation, central spinal canal stenosis or neural foraminal narrowing.
T4-5, there is no significant disc herniation, central spinal canal stenosis or neural foraminal narrowing.
T5-6, there is no significant disc herniation, central spinal canal stenosis or neural foraminal narrowing.
T6-7, there is no significant disc herniation, central spinal canal stenosis or neural foraminal narrowing.
T7-8, there is no significant disc herniation, central spinal canal stenosis or neural foraminal narrowing.
T8-9, there is no significant disc herniation, central spinal canal stenosis or neural foraminal narrowing.
T9-10, there is no significant disc herniation, central spinal canal stenosis or neural foraminal narrowing.
T10-11, 2 mm midline disc bulge is seen not compromising central canal or neural foramina.
T11-12, there is no significant disc herniation, central spinal canal stenosis or neural foraminal narrowing.
T12-L1, there is no significant disc herniation, central spinal canal stenosis or neural foraminal narrowing.
IMPRESSION: Old 20% superior endplate T11 compression fracture with minor disc bulge at T10-11 otherwise negative exam.

## 2022-09-01 IMAGING — MR MRI CSPINE WO CONTRAST
4 series · 22 of 48 positions shown · non-contrast
Comparison: CT CHEST study 08/03/2022 and thoracic spine MR study from same date.

Images Obtained from [facility]
HISTORY: Other osteoporosis with current pathological fracture, vertebra(e), initial encounter for fracture, Spinal stenosis, cervical region
TECHNIQUE: Multiplanar, multisequential MRI of the cervical spine without intravenous contrast was performed.

[Series 16: t2_sag · sagittal · 3.0mm · 0.57mm/px · 9 of 15 slices shown]
[im 1/15]
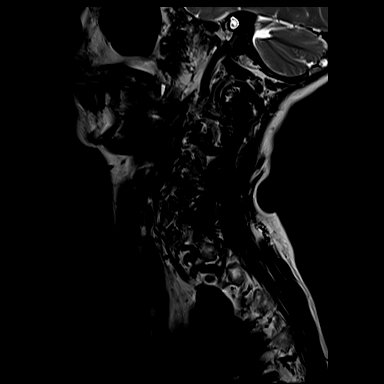
[im 2/15]
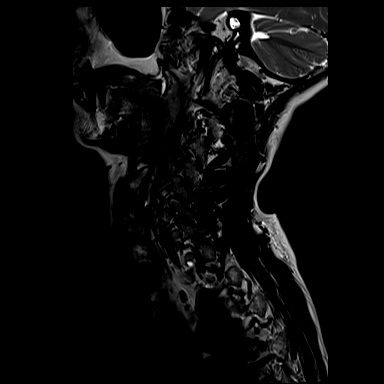
[im 4/15]
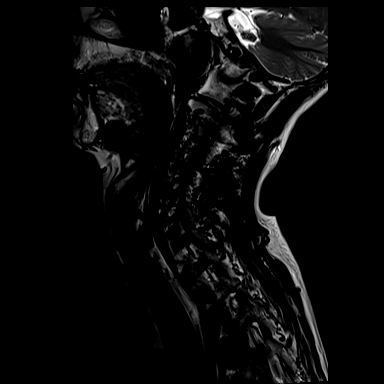
[im 6/15]
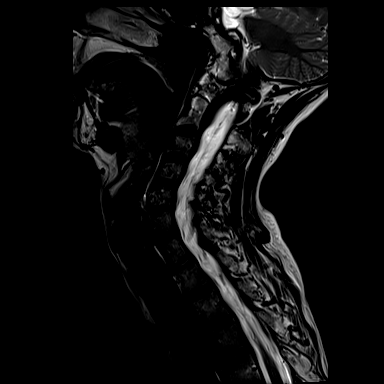
[im 8/15]
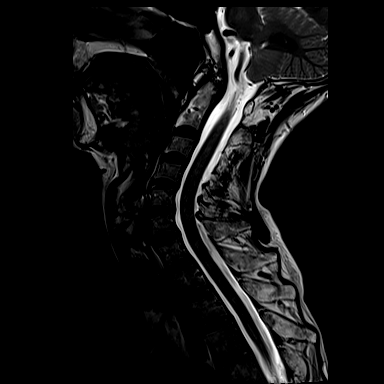
[im 9/15]
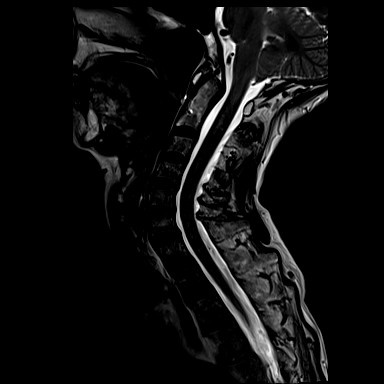
[im 11/15]
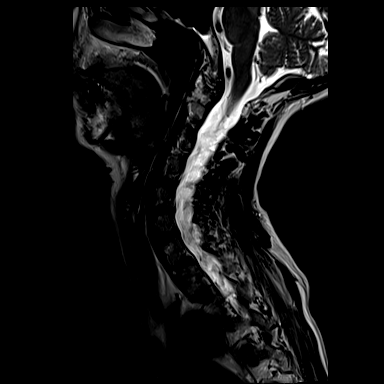
[im 13/15]
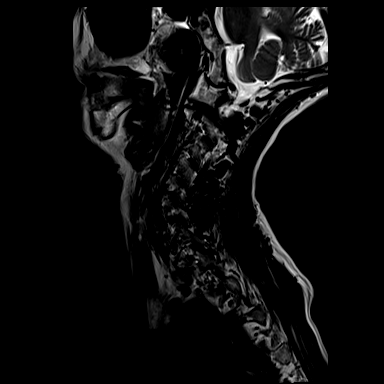
[im 15/15]
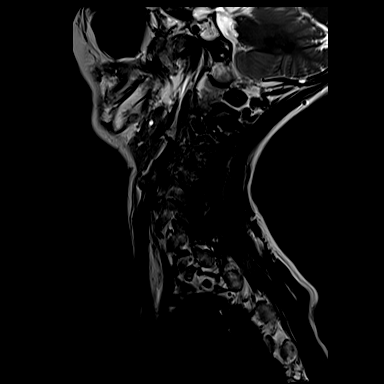

[Series 18: ir_sag · sagittal · 3.0mm · 0.34mm/px · 7 of 15 slices shown]
[im 1/15]
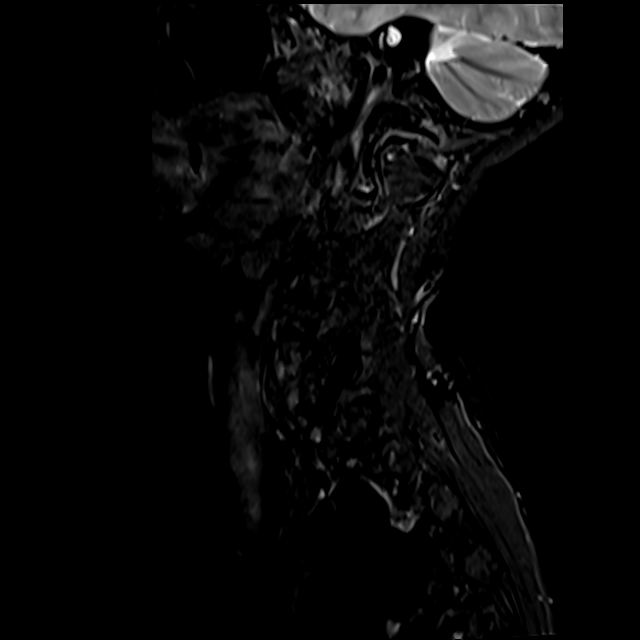
[im 2/15]
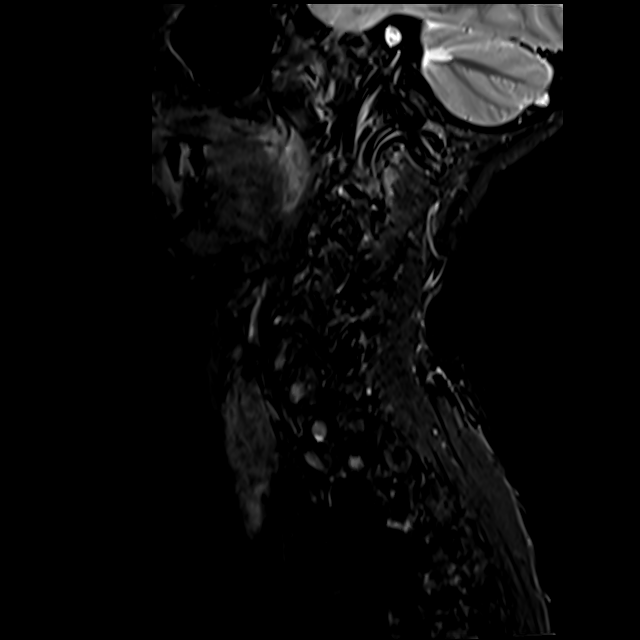
[im 4/15]
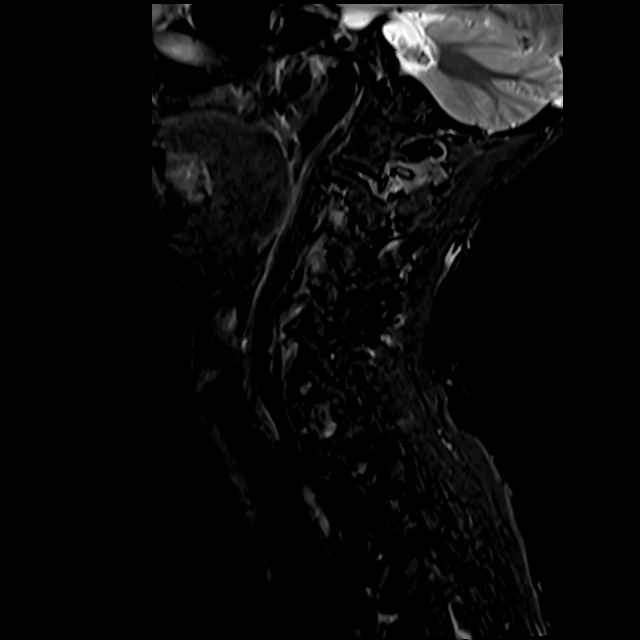
[im 6/15]
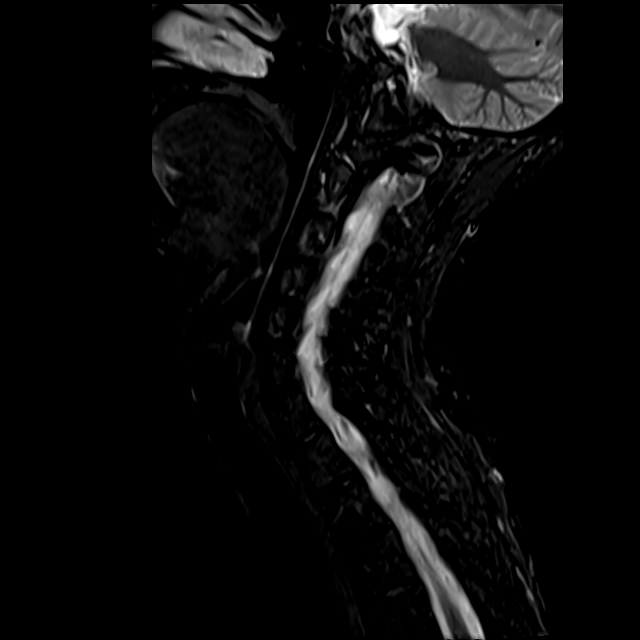
[im 8/15]
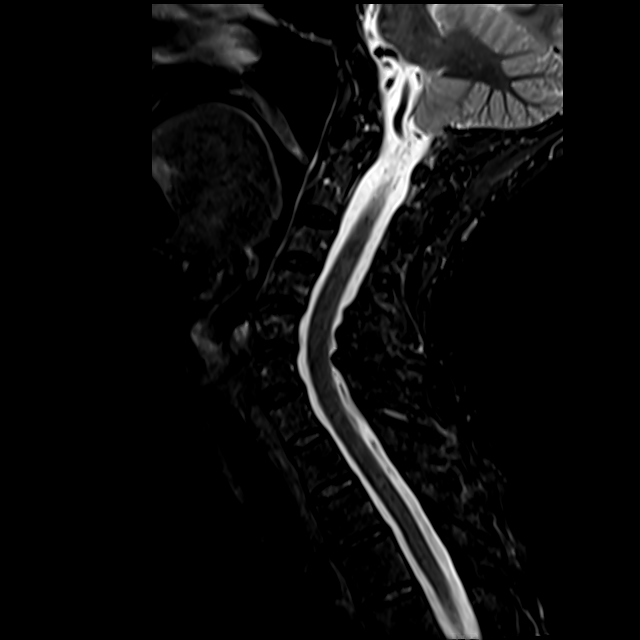
[im 9/15]
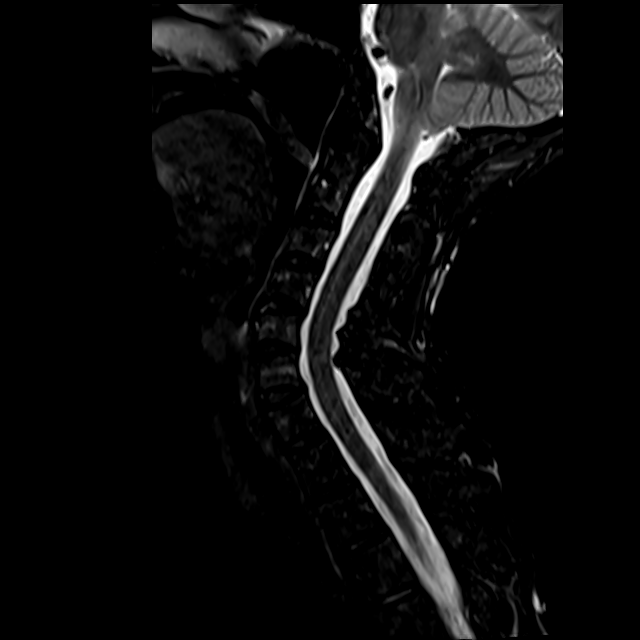
[im 13/15]
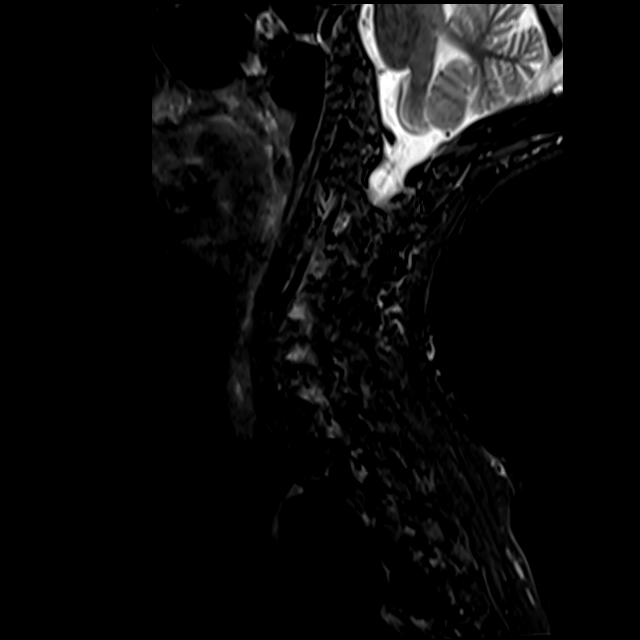

[Series 19: t1_sag_r1 · sagittal · 3.0mm · 0.69mm/px · 3 of 15 slices shown]
[im 2/15]
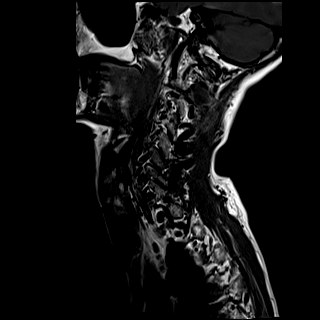
[im 8/15]
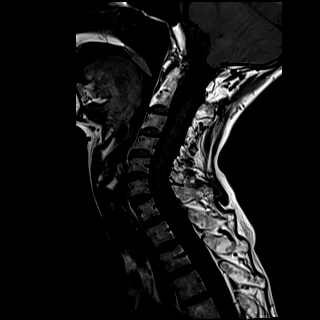
[im 13/15]
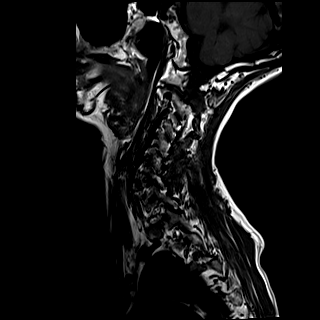

[Series 20: t2_axial · axial · 3.0mm · 0.56mm/px · z∈[-69,+10]mm · 3 of 34 slices shown]
[im 5/34]
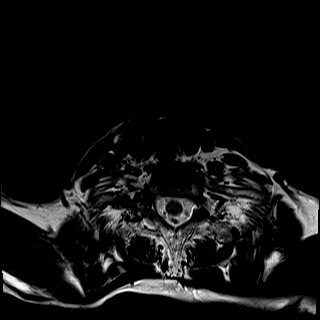
[im 17/34]
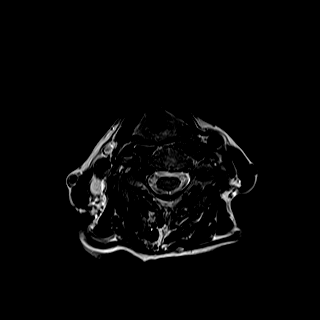
[im 29/34]
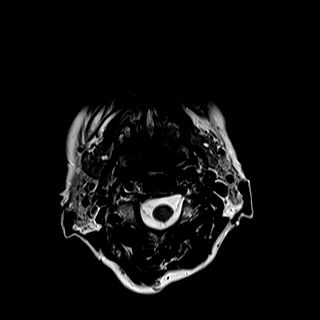

[22 of 48 positions shown; findings below may reference images not displayed]

FINDINGS: 7 nonrib-bearing cervical vertebrae seen. Accentuation of normal lordotic cervical curvature with apex at C5 seen. Curvature of lower cervical spine to left with apex at the C6 seen. Mild
endplate spurs of cervical spine seen. Minimal degenerative endplate changes seen. No fracture, spondylolisthesis or obvious spondylolysis seen.
Decreased disc T2 signal of cervical spine seen. Intervertebral disc height is maintained.
C2-3:  No disc herniation, spinal canal stenosis or neural foraminal stenosis seen..
C3-4:  No disc herniation, spinal canal stenosis or neural foraminal stenosis seen.
C4-5:  No disc herniation or spinal canal stenosis seen. Mild right C5 neural foraminal stenosis due to uncovertebral spur formation and degenerative facets seen. Left C5 neural foramina is patent.
C5-6:  Minimal disc bulge seen. There is no disc herniation, spinal canal stenosis or neural foraminal stenosis.
C6-7:  Mild disc bulge seen. There is no disc herniation, spinal canal stenosis or neural foraminal stenosis.
C7-T1:  There is no disc herniation
Posterior cranial fossa, cervical and upper thoracic spinal cords are of normal signal and morphology, spinal canal stenosis or neural foraminal stenosis..
Paravertebral soft tissues are unremarkable.
IMPRESSION: 1.  Degenerative changes of cervical spine with accentuation of normal lordotic cervical curvature with apex at C5 seen. Curvature of lower cervical spine to left with apex at C6 seen.
2.  Mild right C5 neural foraminal stenosis seen.
3.  No other disc herniation, spinal canal stenosis or neural foraminal stenosis seen.

## 2022-10-23 NOTE — Telephone Encounter (Signed)
error 

## 2022-10-23 NOTE — Telephone Encounter (Signed)
Call Dr Shaune Leeks [phone] (pat. Husband) says accidentally cut phone off from [place] while talking

## 2022-10-23 NOTE — Telephone Encounter (Signed)
Spoke with dr Mayford Knife

## 2022-10-23 NOTE — Telephone Encounter (Deleted)
Call Dr Nida Boatman Glahn(pat. Husband) was just talking to [facility] and got cut off

## 2022-10-25 ENCOUNTER — Encounter
Admit: 2022-10-25 | Discharge: 2022-10-25 | Payer: PRIVATE HEALTH INSURANCE | Attending: Physician Assistant | Primary: Internal Medicine

## 2022-10-25 DIAGNOSIS — M533 Sacrococcygeal disorders, not elsewhere classified: Secondary | ICD-10-CM

## 2022-10-25 NOTE — Progress Notes (Signed)
Name: Monique Barker  Date of Birth: October 22, 1955  Gender: female  MRN: [id number]         *ANNUAL VISIT *        CC:   Chief Complaint   Patient presents with    New Patient     Low back pain          HPI:   Monique Barker is a 67 y.o. female with  PMHx below.  There are an established  patient of Dr. Daphine Deutscher of this practice, and present here for Annual Visit.        This patient has previously been treated by Dr. Daphine Deutscher with lumbar injection therapy years ago.  She presents today for evaluation of right posterior hip and lower back pain.  Does not have significant rating pain down the leg per se.  She underwent an MRI of the lumbar spine in February of this year noted below.  She has a history of chronic lower back pain and as well as severe scoliosis.  She typically manages her back pain with a home exercise program as well as regular Pilates and walking and daily stretches.  She takes diclofenac as needed.  She has some occasional hip bursitis particularly on the left that is been treated in the past with bursa injections.  She has trouble particularly in the morning when getting up first.  She will also have pain in her lower back particular with certain motions such as twisting and bending.  She feels her back and posterior pain is significantly limiting in terms of her activity, she is a very active person with exercising and helping care for her aging mother.  Currently denies any significant radiating pain down the leg.  No numbness or tingling.            Past Medical History Includes:   Past Medical History:   Diagnosis Date    Arthritis     Dyslipidemia     MRSA infection     x2 on leg last episode 4 years ago   ,   Past Surgical History:   Procedure Laterality Date    ANESTH,SURGERY OF SHOULDER Right     arthroscopy    ORTHOPEDIC SURGERY      left ankle    PR ABDOMEN SURGERY PROC UNLISTED      chole     Family History:   Family History   Problem Relation Age of Onset    Cancer Father         different skin  ca    Cancer Sister     Cancer Sister         brain tumor on thalmus    Parkinson's Disease Father     Cancer Mother     Diabetes Mother         borddrline      Social History:   Social History     Tobacco Use    Smoking status: Never    Smokeless tobacco: Never   Substance Use Topics    Alcohol use: No       ALLERGIES:   Allergies   Allergen Reactions    Pravastatin Myalgia    Methylprednisolone Hives     Patient states this only happened once with a taper.  Taken prior stable doses for several days with no reaction. No prior reactions with steroid injections in the past.        Patient Medications    Current Outpatient Medications  Medication Sig Dispense Refill    gabapentin (NEURONTIN) 300 MG capsule Take 1 capsule by mouth 3 times daily for 90 days. Intended supply: 30 days 90 capsule 2    Coenzyme Q10 100 MG TABS Take by mouth      Estradiol (VAGIFEM) 10 MCG TABS vaginal tablet Insert one tablet vaginally twice weekly at night.      rosuvastatin (CRESTOR) 5 MG tablet Take 5 mg by mouth      CALCIUM PO Take by mouth      CRANBERRY PO Take by mouth      TURMERIC PO Take by mouth (Patient not taking: Reported on 10/25/2020)      ZINC PO Take by mouth      ascorbic acid (VITAMIN C) 500 MG tablet Take by mouth daily (Patient not taking: Reported on 10/25/2020)      aspirin 81 MG EC tablet Take 81 mg by mouth daily (Patient not taking: Reported on 10/25/2020)      Cholecalciferol 50 MCG (2000 UT) TABS Take by mouth daily      diclofenac (VOLTAREN) 75 MG EC tablet Take by mouth      fexofenadine (ALLEGRA) 180 MG tablet Take by mouth      hydroCHLOROthiazide (HYDRODIURIL) 12.5 MG tablet Take 12.5 mg by mouth daily (Patient not taking: Reported on 10/25/2020)      montelukast (SINGULAIR) 10 MG tablet Take 10 mg by mouth      omeprazole (PRILOSEC) 20 MG delayed release capsule Take 20 mg by mouth daily       No current facility-administered medications for this visit.                   Review of Systems:  As per HPI.   Pertinent positives and negatives are addressed with the patient, particularly those related to musculoskeletal concerns.   Other non-emergent concerns were referred back to the primary care physician.      PHYSICAL EXAMINATION:     The patient appears their stated age and they are in no distress.  Ht 1.651 m (5\' 5" )   BMI 25.13 kg/m         Neuro:   Reflexes  Knees: Normal   Ankles: Normal        Sensory    Sensation to light touch intact L3-S1      Motor    Hip Flexion: grossly normal    Knee Extension:  grossly normal    Ankle Dorsiflexion: grossly normal    Great Toe Exension:  grossly normal       Point tenderness: Positive the right SI joint region    Gait: No appreciable limp or ataxia      Positive Faber, thigh thrust, compression test.        IMAGING:     MRI Result (most recent):  MRI LUMBAR SPINE WO CONTRAST 05/01/2022    Narrative  EXAM:  MRI Lumbar spine    COMPARISON:  Plain film images lumbar spine 10/25/2020, and MRI lumbar spine 06/22/2019    INDICATION:  M54.16 Radiculopathy, lumbar region I10;;1.    Are they claustrophobic? YES/NO: No;2. ???Do they require sedation? YES/NO: No;3.????Are they diabetic??YES/NO: No ;A. If yes, do they have an insulin pump or glucose meter that is on their skin (permanent or removable)? YES/NO: No;4.????Do they have a pacemaker? YES/NO: No;5.????Do they have any metal in their body? YES/NO: No;A. 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  TECHNICAL: Sagittal T1, sagittal T2, sagittal STIR, axial T1, and axial T2 sequences of the lumbar spine performed.    FINDINGS:  Vertebral body height and marrow signal is normal. Alignment is normal.  Redemonstration of advanced rotary scoliosis upper lumbar spine convex toward the right with the apex of the curvature at L1/2.  Conus tip terminates at the L2 vertebral level.  No retroperitoneal mass nor lymphadenopathy present.    T12-L1: Similar small left posterior paracentral and neural foraminal  disc protrusion without significant central canal nor neural foraminal narrowing.  L1-2:  Similar small left posterior paracentral and neural foraminal disc protrusion without significant central canal narrowing. Similar moderate left neural foraminal narrowing.  L2-3:  Similar small posterior paracentral and neural foraminal disc protrusion without significant central canal narrowing. Similar moderately severe left neural foraminal narrowing and mild right neural foraminal narrowing.  L3-4:  Described below  L4-5:  Similar small right posterior paracentral and neural foraminal disc protrusion without significant central canal nor neural foraminal narrowing.  L5-S1:  Similar small posterior central midline disc protrusion situated between the traversing S1 nerve roots without displacement of either nerve root. Accelerated hypertrophic facet arthropathy.    Impression  1.  No acute findings.  2.  In addition to the multilevel abnormalities described above, perhaps the most notable abnormality producing morphologic neural displacement is at L3/4, in which a small right posterior paracentral and intraforaminal disc protrusion and asymmetric hypertrophic facet arthropathy on the right side produces right lateral recess narrowing with the potential impingement on the traversing right L4 nerve root as noted on series 7 image 25.    Signed by: 05/01/2022 9:19 AM: Sheran Lawless MD, Nedra Hai        I reviewed x-rays of the cervical thoracic and lumbar spine performed 04/03/2021 at [facility] via care everywhere.  It reveals thoracal lumbar scoliosis.  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  Mild degeneration of the bilateral SI joints in the pelvis.          ASSESSMENT AND PLAN:     The patient has multilevel degenerative changes to the lumbar spine.  Advanced scoliosis.  Any of these features could contribute to her lower back and posterior right hip pain.   She has failed continued conservative management of her symptoms.  Given her pathology I would recommend starting with a right SI joint injection with Dr. Daphine Deutscher.  Plan to follow-up with the patient as needed going forward, if she continues to have pain we may consider further treatment with other lumbar injection therapy.  I will see the patient back as needed going forward.  She will continue her home exercise program afterwards

## 2022-11-01 ENCOUNTER — Ambulatory Visit
Admit: 2022-11-01 | Discharge: 2022-11-01 | Payer: PRIVATE HEALTH INSURANCE | Attending: Physical Medicine & Rehabilitation | Primary: Internal Medicine

## 2022-11-01 ENCOUNTER — Ambulatory Visit: Admit: 2022-11-01 | Discharge: 2022-11-01 | Payer: PRIVATE HEALTH INSURANCE | Primary: Internal Medicine

## 2022-11-01 DIAGNOSIS — M533 Sacrococcygeal disorders, not elsewhere classified: Secondary | ICD-10-CM

## 2022-11-01 MED ORDER — TRIAMCINOLONE ACETONIDE 40 MG/ML IJ SUSP
40 | Freq: Once | INTRAMUSCULAR | Status: AC
Start: 2022-11-01 — End: 2022-11-01
  Administered 2022-11-01: 14:00:00 40 mg

## 2022-11-01 NOTE — Progress Notes (Signed)
Sacroiliac Joint Injection Procedure Note    Procedure: Right sacroiliac joint steroid injection    Precautions: Monique Barker reports prior sensitivity to steroid, local anesthetic, iodine, or shellfish.  She reports allergy to oral methylprednisolone but reports she has had multiple steroid injections in the past without difficulty/complications/side effects, including prior lumbar epidural steroid injection with me with betamethasone.      Consent:  Consent was obtained prior to the procedure. The procedure was discussed at length with Monique Barker. She was given the opportunity to ask questions regarding the procedure and its associated risks.  In addition to the potential risks associated with the procedure itself, the patient was informed both verbally and in writing of potential side effects of the use glucocorticoids.  The patient appeared to comprehend the informed consent and desired to have the procedure performed, and informed consent was signed.     She was placed in a prone position on the fluoroscopy table and the skin was prepped and draped in a routine sterile fashion. The areas to be injected were each anesthetized with 1 ml of 1% Lidocaine. A 22 gauge 3.5 inch spinal needle was carefully advanced under fluoroscopic guidance to the right sacroiliac joint space  0.5 ml of 70% of Omnipaque was injected to confirm proper needle placement and absence of subdural or vascular flow Once proper placement was confirmed, 2 ml of 0.25 marcaine and 40 mg of kenalog were injected through the spinal needle.     Fluoroscopic guidance was used intermittently over a 10-minute period to insure proper needle placement and her safety. A hard copy of the fluoroscopic image has been placed in her chart and is saved on the C-arm hard drive. She was monitored for 30 minutes after the procedure and discharged home in a stable fashion with a routine follow up.    Procedural Diagnosis:     ICD-10-CM    1. SI  (sacroiliac) joint dysfunction  M53.3 XR INJ SI JT W WO ARTRHOGRAM     triamcinolone acetonide (KENALOG-40) injection 40 mg      2. Lumbar spondylosis  M47.816 XR INJ SI JT W WO ARTRHOGRAM     triamcinolone acetonide (KENALOG-40) injection 40 mg         Ferrel Logan, MD  11/01/22

## 2022-11-13 ENCOUNTER — Ambulatory Visit
Admit: 2022-11-13 | Discharge: 2022-11-13 | Payer: PRIVATE HEALTH INSURANCE | Attending: Orthopaedic Surgery | Primary: Internal Medicine

## 2022-11-13 ENCOUNTER — Ambulatory Visit: Admit: 2022-11-13 | Payer: PRIVATE HEALTH INSURANCE | Primary: Internal Medicine

## 2022-11-13 DIAGNOSIS — M25551 Pain in right hip: Secondary | ICD-10-CM

## 2022-11-13 NOTE — Progress Notes (Signed)
 Name: Monique Barker  Date of Birth: 1955-03-31  Gender: female  MRN: [id number]    CC:   Chief Complaint   Patient presents with    Hip Pain     Bil hips          HPI: Monique Barker is a 67 y.o. female with a  has a past medical history of Arthritis, Dyslipidemia, and MRSA infection. And chronic back pain treated by Dr. Gladis here for evaluation of bilateral hip pain  There was not an acute injury  Regarding the hip pain, it has been present for years and remains the same.  The pain is primarily lateral, over greater trochanter  she describes the pain as aching and sharp  The pain is worse with lying on either side  The pain does not radiate down the leg.  she denies numbness and tingling down the leg.   Treatment so far has been physical therapy without functional improvement and cortisone injections  with some  relief.  More recently she has had some lower back right buttock and right groin pain.  She has been seen by Dr. Gladis and has undergone injection of her SI joint with minimal improvement.  The patient had an MRI scan of the lumbar spine in February of this year.      Review of Systems  As per HPI.  Pertinent positives and negatives are addressed with the patient, particularly those related to musculoskeletal concerns.  Non-orthopaedic concerns were referred back to the primary care physician.      Allergies   Allergen Reactions    Pravastatin Myalgia    Methylprednisolone Hives     Patient states this only happened once with a taper.  Taken prior stable doses for several days with no reaction. No prior reactions with steroid injections in the past.                    PHYSICAL EXAMINATION:   The patient is alert and oriented, in no distress.  There were no vitals filed for this visit.       The gait is noted to be with a slight trendelenburg and antalgia  There is mild tenderness to palpation along the spinous processes and paraspinal musculature.   There mild tenderness to palpation over the right  greater trochanter  bilateral hip range of motion is 0-90 degrees of flexion and 20 degrees on abduction and adduction.  There is 15 degrees internal rotation and 25 degrees of external rotation without groin pain  Limb lengths are equal.  Straight leg test is Positive right  Stability is normal bilaterally.  Their judgment and insight are normal.  They are oriented to time, place and person.  Their memory is good and the mood and affect appropriate.           XRAYS:   AP pelvis and lateral views of the bilateral hips are reviewed.  There is  Mild to moderate joint space loss, eburnated bone and osteophyte formation of the hip.  X-ray impression: moderate osteoarthropathy of the right hip   and views of the left hip show well-preserved joint space with no significant eburnated bone and osteophyte formation of the hip.  X-ray impression:  Normal left hip        IMPRESSION:      ICD-10-CM    1. Right hip pain  M25.551 XR HIP 3-4 VW W PELVIS BILATERAL      2. Left hip pain  M25.552  XR HIP 3-4 VW W PELVIS BILATERAL            RECOMMENDATION:    X-rays were reviewed with the patient today.     This patient's clinical history and physical exam is consistent with osteoarthritis of the right hip(s). We discussed the natural history of this condition as a degenerative process that causes inflammation of the joints due to a breakdown of cartilage, the hard, thick tissue that cushions the joints. We discussed that osteoarthritis never completely resolves on its own and symptoms including pain, stiffness, and swelling may wax and wane as the condition progresses. She understands that conservative treatments typically include activity modification, NSAIDs and physical therapy.  Oral and/or steroid injections into the joint could be considered in resistant scenarios. I advised avoiding any prolonged bedrest and to try to maintain ADLs as much as possible. The patient was counseled to follow up with me should she develop any  worsening symptoms that begin to limit activities of daily living.   The patient has minimal symptoms of osteoarthritis of the right hip and no clinical findings.  Changes only noted on x-ray.  This patient's clinical history and physical exam is consistent with trochanteric bursitis. We discussed that bursa is a small fluid-filled sac that helps the tissues around a joint slide over one another easily. Injury or overuse of a joint can cause pain, redness, and inflammation in the bursa (bursitis). Bursitis usually improves with avoiding the activity that caused it, NSAIDs, ice, and physical therapy. In some cases, a steroid injection into the bursa may be beneficial. Future episodes of bursitis can be prevented with stretching and strengthening exercises.    I am concerned about further steroid injections of either hip as it might lead to rupture of her gluteal tendons.  This patient's clinical history and physical exam is consistent with a right  L5 and S1 lumbar radiculopathy. We discussed the natural history of lumbar radiculopathy in that many of these patients have near complete resolution of their symptoms within eight to twelve weeks with conservative care. We discussed that conservative treatments typically start with activity modification, and medication followed by physical therapy as symptoms allow.  Oral and/or epidural steroids are other options.     ----The patient will be treated observantly with self directed symptomatic care. Instructions were given to follow up if there is any neurologic or functional decline.  Refer back to Dr. Gladis for consideration of epidural steroid injection or selective nerve block of L5.    4--this is a chronic illness/condition with exacerbation

## 2022-11-13 NOTE — Telephone Encounter (Signed)
 Spoke with pt appt done for f/u with andrew

## 2022-11-13 NOTE — Telephone Encounter (Signed)
 She is returning a call to [facility]. Please try her again.

## 2022-11-15 ENCOUNTER — Ambulatory Visit
Admit: 2022-11-15 | Discharge: 2022-11-15 | Payer: PRIVATE HEALTH INSURANCE | Attending: Physician Assistant | Primary: Internal Medicine

## 2022-11-15 VITALS — Ht 65.0 in | Wt 142.0 lb

## 2022-11-15 DIAGNOSIS — M47816 Spondylosis without myelopathy or radiculopathy, lumbar region: Secondary | ICD-10-CM

## 2022-11-15 NOTE — Progress Notes (Signed)
 11/15/22        Name: Kanika Bungert  Date of Birth: [birth year]  Gender: female  MRN: [id number]    CC: Follow-up       HPI: Monique Barker is a 67 y.o. female who returns for Follow-up         Patient returns the office for follow-up.  Not much improvement with SI joint injection.  Still having primarily right-sided lower back and right posterior hip pain.  She is continue to back exercise program daily for the last 6 weeks without meaningful improvement.  Having difficulty exercising and walking.  Pain level up to a 7 out of 10 at times.      History was obtained by patient      Meds/PSH/PMH/FH/SH: This information has been reviewed.      ALLERGIES:   Allergies   Allergen Reactions    Pravastatin Myalgia     Other reaction(s): Myalgia, Myalgia-Intolerance    Methylprednisolone Hives     Patient states this only happened once with a taper.  Taken prior stable doses for several days with no reaction. No prior reactions with steroid injections in the past.              Physical Examination:            Imaging:         MRI Result (most recent):  MRI LUMBAR SPINE WO CONTRAST 05/01/2022    Narrative  EXAM:  MRI Lumbar spine    COMPARISON:  Plain film images lumbar spine 10/25/2020, and MRI lumbar spine 06/22/2019    INDICATION:  M54.16 Radiculopathy, lumbar region I10;;1.    Are they claustrophobic? YES/NO: No;2. ???Do they require sedation? YES/NO: No;3.????Are they diabetic??YES/NO: No ;A. If yes, do they have an insulin pump or glucose meter that is on their skin (permanent or removable)? YES/NO: No;4.????Do they have a pacemaker? YES/NO: No;5.????Do they have any metal in their body? YES/NO: No;A. 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    TECHNICAL: Sagittal T1, sagittal T2, sagittal STIR, axial T1, and axial T2 sequences of the lumbar spine performed.    FINDINGS:  Vertebral body height and marrow signal is normal. Alignment is normal.  Redemonstration of advanced rotary scoliosis  upper lumbar spine convex toward the right with the apex of the curvature at L1/2.  Conus tip terminates at the L2 vertebral level.  No retroperitoneal mass nor lymphadenopathy present.    T12-L1: Similar small left posterior paracentral and neural foraminal disc protrusion without significant central canal nor neural foraminal narrowing.  L1-2:  Similar small left posterior paracentral and neural foraminal disc protrusion without significant central canal narrowing. Similar moderate left neural foraminal narrowing.  L2-3:  Similar small posterior paracentral and neural foraminal disc protrusion without significant central canal narrowing. Similar moderately severe left neural foraminal narrowing and mild right neural foraminal narrowing.  L3-4:  Described below  L4-5:  Similar small right posterior paracentral and neural foraminal disc protrusion without significant central canal nor neural foraminal narrowing.  L5-S1:  Similar small posterior central midline disc protrusion situated between the traversing S1 nerve roots without displacement of either nerve root. Accelerated hypertrophic facet arthropathy.    Impression  1.  No acute findings.  2.  In addition to the multilevel abnormalities described above, perhaps the most notable abnormality producing morphologic neural displacement is at L3/4, in which a small right posterior paracentral and intraforaminal disc protrusion and asymmetric  hypertrophic facet arthropathy on the right side produces right lateral recess narrowing with the potential impingement on the traversing right L4 nerve root as noted on series 7 image 25.    Signed by: 05/01/2022 9:19 AM: Sharyne MD, Jama            Assessment and Plan    We again discussed options for management.  I recommend a right L3 and L4 selective nerve root block.  Patient is agreeable.  She will follow-up with me as needed going forward.

## 2022-11-19 ENCOUNTER — Encounter: Payer: PRIVATE HEALTH INSURANCE | Attending: Physician Assistant | Primary: Internal Medicine

## 2022-11-22 ENCOUNTER — Ambulatory Visit
Admit: 2022-11-22 | Discharge: 2022-11-22 | Payer: PRIVATE HEALTH INSURANCE | Attending: Physical Medicine & Rehabilitation | Primary: Internal Medicine

## 2022-11-22 ENCOUNTER — Ambulatory Visit: Admit: 2022-11-22 | Payer: PRIVATE HEALTH INSURANCE | Primary: Internal Medicine

## 2022-11-22 ENCOUNTER — Encounter: Payer: PRIVATE HEALTH INSURANCE | Attending: Physical Medicine & Rehabilitation | Primary: Internal Medicine

## 2022-11-22 DIAGNOSIS — M47816 Spondylosis without myelopathy or radiculopathy, lumbar region: Secondary | ICD-10-CM

## 2022-11-22 MED ORDER — TRIAMCINOLONE ACETONIDE 40 MG/ML IJ SUSP
40 | Freq: Once | INTRAMUSCULAR | Status: AC
Start: 2022-11-22 — End: 2022-11-22

## 2022-11-22 MED ADMIN — triamcinolone acetonide (KENALOG-40) injection 40 mg: 40 mg | @ 16:00:00 | NDC 00003029328

## 2022-11-22 NOTE — Progress Notes (Signed)
 Name: Monique Barker  Date of Birth: 1955/06/11  Gender: female  MRN: [id number]        Transforaminal ESI Procedure Note    Procedure: Right  L3-L4 and L4-L5 transforaminal epidural steroid injections     Precautions: Monique Barker denies prior sensitivity to steroid, local anesthetic, iodine, or shellfish.       Consent:  Consent was obtained prior to the procedure. The procedure was discussed at length with Monique Barker. She was given the opportunity to ask questions regarding the procedure and its associated risks.  In addition to the potential risks associated with the procedure itself, the patient was informed both verbally and in writing of potential side effects of the use glucocorticoids.  The patient appeared to comprehend the informed consent and desired to have the procedure performed, and informed consent was signed.     She was placed in a prone position on the fluoroscopy table and the skin was prepped and draped in a routine sterile fashion. The areas to be injected were each anesthetized with 1 ml of 1% Lidocaine. A 22 gauge 3.5 inch spinal needle was carefully advanced under fluoroscopic guidance to the right L4-L5 transforaminal space  0.5 ml of 70% of Omnipaque was injected to confirm proper needle placement and absence of subdural or vascular flow Once proper placement was confirmed, 0.5ml of 0.25 marcaine and 40 mg kenalog  were injected through the spinal needle.       The above procedure was then repeated at the Right  L3-L4 transforaminal space.    Fluoroscopic guidance was used intermittently over a 10-minute period to insure proper needle placement and her safety. A hard copy of the fluoroscopic image has been placed in her chart and is saved on the C-arm hard drive. She was monitored for 30 minutes after the procedure and discharged home in a stable fashion with a routine follow up.    Procedural Diagnosis:     ICD-10-CM    1. Lumbar spondylosis  M47.816 FL NERVE BLOCK  LUMBOSACRAL 1ST     FL NERVE BLOCK LUMBOSACRAL EACH ADD     triamcinolone  acetonide (KENALOG -40) injection 40 mg      2. Lumbar radiculopathy  M54.16 FL NERVE BLOCK LUMBOSACRAL 1ST     FL NERVE BLOCK LUMBOSACRAL EACH ADD     triamcinolone  acetonide (KENALOG -40) injection 40 mg      3. DDD (degenerative disc disease), lumbar  M51.36 FL NERVE BLOCK LUMBOSACRAL 1ST     FL NERVE BLOCK LUMBOSACRAL EACH ADD     triamcinolone  acetonide (KENALOG -40) injection 40 mg      4. Lumbar stenosis with neurogenic claudication  M48.062 FL NERVE BLOCK LUMBOSACRAL 1ST     FL NERVE BLOCK LUMBOSACRAL EACH ADD     triamcinolone  acetonide (KENALOG -40) injection 40 mg         Stephan Nelis P Komal Stangelo, MD  11/22/22

## 2022-12-21 ENCOUNTER — Ambulatory Visit: Admit: 2022-12-21 | Discharge: 2022-12-21 | Payer: PRIVATE HEALTH INSURANCE | Primary: Internal Medicine

## 2022-12-21 ENCOUNTER — Ambulatory Visit: Admit: 2022-12-21 | Payer: PRIVATE HEALTH INSURANCE | Primary: Internal Medicine

## 2022-12-21 NOTE — Progress Notes (Signed)
Name: Monique Barker  Date of Birth: 09/21/1955  Gender: female  MRN: [id number]  Age: 67 y.o.    Chief Complaint:   Chief Complaint   Patient presents with    New Patient     Low back pain          History of Present Illness:      This is a very pleasant 67 y.o. female who presents with a several year history of back pain.  Recently this has worsened to include the right hip and buttock.  She has been seen by several providers before including a deformity surgeon at [facility] in [place].  She was told by this previous provider that she needed surgery.  She has tried physical therapy in the past.  She also remains very active doing Pilates.  About a month ago, she had a right-sided L3-4, L4-5 transforaminal epidural steroid injection.  She reports that this gave her about 60% relief.  She is wanting to return to tennis and some other activities but is hesitant to for fear of worsening her condition.  She is otherwise healthy.  She is here today with her husband, Dr. Shaune Leeks who is an ophthalmologist.       Prior Interventions:  Right-sided L3-4, L4-5 transforaminal epidural steroid injection with Dr. Narda Amber on 11/22/2022    Social History:  Social History     Tobacco Use    Smoking status: Never    Smokeless tobacco: Never   Substance Use Topics    Alcohol use: No        What is your occupation? retired    Are you disabled? no    Who do you live with? Spouse    Who referred you here?   No ref. provider found      Medications:  Prior to Visit Medications    Medication Sig Taking? Authorizing Provider   gabapentin (NEURONTIN) 300 MG capsule Take 1 capsule by mouth 3 times daily for 90 days. Intended supply: 30 days  Hunt, Amy S, PA-C   Coenzyme Q10 100 MG TABS Take by mouth  [provider]   Estradiol (VAGIFEM) 10 MCG TABS vaginal tablet Insert one tablet vaginally twice weekly at night.  [provider]   rosuvastatin (CRESTOR) 5 MG tablet Take 5 mg by mouth   [provider]   CALCIUM PO Take by mouth  Automatic Reconciliation, Ar   CRANBERRY PO Take by mouth  Automatic Reconciliation, Ar   TURMERIC PO Take by mouth  Patient not taking: Reported on 10/25/2020  Automatic Reconciliation, Ar   ZINC PO Take by mouth  Automatic Reconciliation, Ar   ascorbic acid (VITAMIN C) 500 MG tablet Take by mouth daily  Patient not taking: Reported on 10/25/2020  Automatic Reconciliation, Ar   aspirin 81 MG EC tablet Take 81 mg by mouth daily  Patient not taking: Reported on 10/25/2020  Automatic Reconciliation, Ar   Cholecalciferol 50 MCG (2000 UT) TABS Take by mouth daily  Automatic Reconciliation, Ar   diclofenac (VOLTAREN) 75 MG EC tablet Take by mouth  Automatic Reconciliation, Ar   fexofenadine (ALLEGRA) 180 MG tablet Take by mouth  Automatic Reconciliation, Ar   hydroCHLOROthiazide (HYDRODIURIL) 12.5 MG tablet Take 12.5 mg by mouth daily  Patient not taking: Reported on 10/25/2020  Automatic Reconciliation, Ar   montelukast (SINGULAIR) 10 MG tablet Take 10 mg by mouth  Automatic Reconciliation, Ar   omeprazole (PRILOSEC) 20 MG delayed release capsule Take 20  mg by mouth daily  Automatic Reconciliation, Ar          Allergies:  Allergies   Allergen Reactions    Pravastatin Myalgia     Other reaction(s): Myalgia, Myalgia-Intolerance    Methylprednisolone Hives     Patient states this only happened once with a taper.  Taken prior stable doses for several days with no reaction. No prior reactions with steroid injections in the past.         Physical Exam:     The patient is well-developed and well nourished. Mood and cognition appear normal. Patient appears oriented to person/place/time.  and No evidence of increased work of breathing on exam.     The patient stands with a balanced posture.  There is no visible coronal or sagittal malalignment.    The patient performs tandem gait with no obvious difficulty.    Straight leg testing is Negative bilaterally    Hip ROM is Normal  bilaterally    Sensory testing reveals intact sensation to light touch and in the distribution of the L2-S1 dermatomes bilaterally.    Reflexes   Right Left   Quadriceps (L4) 2 2     Ankle jerk testing is negative for clonus bilaterally.    Strength testing in the lower extremity reveals the following based on the 5 point grading scale:     HF (L2) KE (L3/4) ADF (L4) EHL (L5) APF (S1)   Right 5 5 5 5 5    Left 5 5 5 5 5             Radiographic Studies:         X-rays including AP and lateral views of the lumbar spine were reviewed and interpreted:     Standing films of the thoracolumbar spine reveal significant right-sided coronal deformity with multiple levels of lateral listhesis in the lower lumbar spine.  Cobb angle 65 degrees.  Abundant osteophytes on both the concavity and convexity of the curve.  Stairstep right-sided lateral listhesis of L3-4 and L4-5.  Lateral views demonstrate abundant spondylosis but no visible anterolisthesis..    Radiographic Impression:    Scoliosis and lateral listhesis as noted above    MRI of the lumbar spine images were reviewed and independently interpreted from May 05, 2022: Significant coronal deformity and right-sided convexity.  Multiple levels of facet arthropathy, disc degeneration, most severe on the right side at L3-4 and L4-5.      Diagnosis:      ICD-10-CM    1. 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  We had a really good discussion regarding the nature of her pathology as well as what would be most reasonable going forward.  I do not think that she should abstain from any activities as long  as they do not involve excessive bending, lifting, twisting.  She would like to try formal physical therapy with one of our therapist, specializing on the lumbar spine.  Also, I think it would be beneficial from both a diagnostic and therapeutic standpoint for her to continue under the  care of  Dr. Daphine Deutscher that can perform a right L3-4, right L4-5 transforaminal epidural steroid injection.  We discussed that if she continues to have meaningful relief from this in regards to her right low back and right hip/buttock pain-it may be reasonable to seriously consider a more minimal decompression in the future.  I explained that any fusion surgery would likely require a long segment fusion given the lateral listhesis and coronal curve and we would like to avoid this if at all possible.    The patient is going to reach out to Dr. Daphine Deutscher to schedule another injection.  I would like to see her back in 3 months to assess her progress and check-in on how she is doing.  She certainly knows that she can call me at any time and I am happy to see her.                Electronically signed by Revonda Standard, MD  12/21/22  11:48 AM

## 2022-12-21 NOTE — Telephone Encounter (Signed)
I called the patient about her therapy referral. She was suppose to make an appointment for back therapy with our office at checkout. I was making sure that this was not missed. She can call our office back at [phone].

## 2023-01-23 ENCOUNTER — Encounter
Payer: PRIVATE HEALTH INSURANCE | Attending: Rehabilitative and Restorative Service Providers" | Primary: Internal Medicine

## 2023-01-23 NOTE — Progress Notes (Deleted)
 [facility]  [address]  [facility] [address]  Dept: [phone]      Physical Therapy Initial Assessment     Referring MD: Smitty Cords *  Diagnosis: No diagnosis found.   Surgery: ***    Therapy precautions:{pr

## 2023-02-12 ENCOUNTER — Encounter
Payer: PRIVATE HEALTH INSURANCE | Attending: Rehabilitative and Restorative Service Providers" | Primary: Internal Medicine

## 2023-02-25 LAB — HEMOGLOBIN A1C: Hemoglobin A1C, External: 5.4 %

## 2023-03-01 NOTE — Telephone Encounter (Signed)
 LVM for patient that Alexis's schedule was available to schedule her in Jan per her request

## 2023-03-18 ENCOUNTER — Ambulatory Visit: Admit: 2023-03-18 | Payer: BLUE CROSS/BLUE SHIELD | Primary: Internal Medicine

## 2023-03-18 ENCOUNTER — Encounter: Admit: 2023-03-18 | Payer: BLUE CROSS/BLUE SHIELD | Admitting: Orthopaedic Surgery | Primary: Internal Medicine

## 2023-03-18 DIAGNOSIS — M79672 Pain in left foot: Principal | ICD-10-CM

## 2023-03-18 NOTE — Progress Notes (Signed)
 Name: Monique Barker  Date of Birth: 1955-10-28  Gender: female  MRN: [id number]    CC: Left foot pain    HPI:   03/18/2023: Left outer foot pain: Failed dermatologic shaving    ROS/Meds/PSH/PMH/FH/SH: Only reviewed pertinent/relevant information      Tobacco:  reports that she has never smoked. She has never used smokeless tobacco.     Physical Examination:  Patient appears to be alert and oriented with acceptable appearance.  No obvious distress or SOB  CV: LE acceptable vascular flow, color and capillary refill  Neuro: LE mostly intact light touch sensation   Skin: Left = multiple lateral foot seed corns with no evidence for infection or true ulceration; venous stasis with medial redness   MS: Standing: Pes planus: 2-3 claw toes:  Left = lateral border foot multiple seed corns without true ulceration or infection    Reviewed Test/Records/Documents:   10/22/2022: Dr. Oren: Lumbosacral spondylosis without myelopathy: Chronic pain syndrome: Sacroiliitis:   02/28/2023: Dr. Greig welcome: Hypercholesterolemia: Primary OA: Chronic back pain: Renal cyst: Right lower extremity varicose vein inflammation: Left hip trochanteric bursitis: Other specified disorders of bone density and structure: Multiple sites: Pressure injury of skin left foot: Referred to Dr. 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 We explored treatment options.     03/18/2023: Initial visit:   Her 2-3 claw toes and midfoot arthritis are not her symptomatic areas  Her main concerns relate to her lateral foot seed corn skin lesions  She reports her  dermatologist tried shaving to no avail    Unfortunately, there is no orthopedic treatment related to resolution  Either podiatry or pedicurist debridements are needed  Hopefully with treatment outlined today, she can gradually resolve with home care    We discussed care, treatment and protection    DME: Air heel  DME medical necessity:  Air heel is necessary to protect/stabilize/offload the infirmed weakened seed corn skin lesions from further damage/injury      PT: No indication  Orthotic/prosthetic: No indication as she is currently able to use UGG shoes     Medication: OTC meds prn unless prohibited by other treating MD/DO/NP/PA:   Advanced medical imaging: No indication      Surgical discussion: No indication    Follow up: As needed  Work status: Regular retired     This note was created using Conservation officer, historic buildings which may result in errors of speech and spelling recognition and word/phrase syntax errors.

## 2023-03-18 NOTE — Progress Notes (Signed)
 Patient was fitted and instructed on an Research officer, political party for the left foot. Patient read and signed documenting they understand and agree to [facility] current DME return policy.

## 2023-05-08 NOTE — Telephone Encounter (Signed)
 Returned patients call and left a vm. If she calls back, I can speak with her

## 2023-05-09 ENCOUNTER — Encounter

## 2023-05-20 ENCOUNTER — Ambulatory Visit: Admit: 2023-05-20 | Discharge: 2023-05-20 | Payer: BLUE CROSS/BLUE SHIELD | Primary: Internal Medicine

## 2023-05-20 ENCOUNTER — Encounter
Payer: BLUE CROSS/BLUE SHIELD | Attending: Rehabilitative and Restorative Service Providers" | Primary: Internal Medicine

## 2023-05-20 DIAGNOSIS — M545 Low back pain, unspecified: Secondary | ICD-10-CM

## 2023-05-20 NOTE — Progress Notes (Signed)
 Name: Monique Barker  Date of Birth: 1955-09-06  Gender: female  MRN: [id number]  Age: 68 y.o.    Chief Complaint: Continued low back pain, rated 6/10 today    Assessment/Plan at Previous Visit: Physical therapy, activity modification    History of Present Illness  The patient is a 68 year old female who presents today for follow-up with continued lower back pain. She was last seen on 12/21/2022 and has previously had right-sided L3-4 and L4-5 transforaminal ESI's with Dr. Daphine Deutscher on 11/22/2022.    She reports intermittent symptoms that seem to resolve spontaneously but then recur in a somewhat cyclical pattern. She experienced a significant improvement in her condition during the Thanksgiving and Christmas periods, attributing this to the use of a back brace, which she found particularly beneficial during prolonged periods in the kitchen. However, she has been experiencing flare-ups, the most recent of which occurred 3 to 4 weeks ago. This episode was characterized by bilateral hip pain at night, radiating down her lateral thighs, and disrupting her sleep. The pain would subside after approximately 10 minutes, allowing her to sleep for 20 to 30 minutes before reawakening. She also reports a history of intermittent greater trochanteric bursitis.     A few days ago she began low back pain that seem to radiate into the posterior aspect of both legs as well as pain in the region of her pubic bone.  She initially thought to be related to the hip flexor. She has been continuing with Pilates exercises but feels like at times she may actually be stretching too much.  She would like to return for more strenuous activities such as tennis and longer distances walking.  She has attempted to use shoe lifts after being given these by a physical therapist but found them unhelpful.     She has been receiving physical therapy from same therapist Efraim Kaufmann) for the past 20 years, which she has found largely beneficial. She also uses  a massage gun for relief at home. She was recently able to use the elliptical machine without severe pain. She performs a series of home exercises exercises including bridges, dead bugs, bird dog, and side leg raises a few times a week in addition to her Pilates routine.  She is scheduled to see a different therapist today to hopefully have a fresh evaluation/perspective on her treatment.               Medications:       Current Outpatient Medications:     Coenzyme Q10 100 MG TABS, Take by mouth, Disp: , Rfl:     Estradiol (VAGIFEM) 10 MCG TABS vaginal tablet, Insert one tablet vaginally twice weekly at night., Disp: , Rfl:     rosuvastatin (CRESTOR) 5 MG tablet, Take 5 mg by mouth, Disp: , Rfl:     CALCIUM PO, Take by mouth, Disp: , Rfl:     CRANBERRY PO, Take by mouth, Disp: , Rfl:     TURMERIC PO, Take by mouth (Patient not taking: Reported on 10/25/2020), Disp: , Rfl:     ZINC PO, Take by mouth, Disp: , Rfl:     ascorbic acid (VITAMIN C) 500 MG tablet, Take by mouth daily (Patient not taking: Reported on 10/25/2020), Disp: , Rfl:     diclofenac (VOLTAREN) 75 MG EC tablet, Take by mouth, Disp: , Rfl:     fexofenadine (ALLEGRA) 180 MG tablet, Take by mouth, Disp: , Rfl:     hydroCHLOROthiazide (HYDRODIURIL) 12.5 MG tablet, Take  12.5 mg by mouth daily (Patient not taking: Reported on 10/25/2020), Disp: , Rfl:     montelukast (SINGULAIR) 10 MG tablet, Take 10 mg by mouth, Disp: , Rfl:     omeprazole (PRILOSEC) 20 MG delayed release capsule, Take 20 mg by mouth daily, Disp: , Rfl:     Allergies:  Allergies   Allergen Reactions    Pravastatin Myalgia     Other reaction(s): Myalgia, Myalgia-Intolerance    Methylprednisolone Hives     Patient states this only happened once with a taper.  Taken prior stable doses for several days with no reaction. No prior reactions with steroid injections in the past.    Other reaction(s): Hives/Swelling-Allergy   Patient states this only happened once with a taper.  Taken prior stable  doses for several days with no reaction. No prior reactions with steroid injections in the past.   Patient states this only happened once with a taper.  Taken prior stable doses for several days with no reaction. No prior reactions with steroid injections in the past.    Evolocumab      Other Reaction(s): Hives/Swelling-Allergy         Physical Exam:     The patient is well-developed and well nourished. Mood and cognition appear normal. Patient appears oriented to person/place/time.  and No evidence of increased work of breathing on exam.       Nonantalgic gait.  Motor grossly intact with no deficit in the bilateral lower extremities  Results      Radiographic Studies:     MRI of the lumbar spine images were reviewed and independently interpreted from May 05, 2022: Significant coronal deformity and right-sided convexity.  Multiple levels of facet arthropathy, disc degeneration, most severe on the right side at L3-4 and L4-5.        Diagnosis:      ICD-10-CM    1. Chronic low back pain, unspecified back pain laterality, unspecified whether sciatica present  M54.50 MRI LUMBAR SPINE WO CONTRAST    G89.29 Ambulatory referral to Physical Therapy     CANCELED: Ambulatory referral to Physical Therapy      2. Lumbar spondylosis  M47.816 MRI LUMBAR SPINE WO CONTRAST     Ambulatory referral to Physical Therapy     CANCELED: Ambulatory referral to Physical Therapy      3. Lumbar radiculopathy  M54.16 MRI LUMBAR SPINE WO CONTRAST     Ambulatory referral to Physical Therapy     CANCELED: Ambulatory referral to Physical Therapy      4. Degeneration of intervertebral disc of lumbar region, unspecified whether pain present  M51.369 MRI LUMBAR SPINE WO CONTRAST     Ambulatory referral to Physical Therapy     CANCELED: Ambulatory referral to Physical Therapy      5. Lumbar stenosis with neurogenic claudication  M48.062 MRI LUMBAR SPINE WO CONTRAST     Ambulatory referral to Physical Therapy     CANCELED: Ambulatory referral to  Physical Therapy          Assessment & Plan  1. Chronic lower back pain.  She reports intermittent lower back pain that sometimes radiates to her hips and thighs, affecting her sleep and daily activities. She was using a back brace intermittently around the 2024 holidays, which provided some relief. She has also been engaging in Pilates and other exercises but has had to cancel some classes due to pain. She experienced a 60% improvement in symptoms following the last injection administered by  Dr. Daphine Deutscher in September 2024.     I would like to obtain a repeat MRI of the lumbar spine to assess any changes since the last imaging over a year ago, in February 2024. A referral to [facility], a highly recommended physical therapist, will be made for further management. She is advised to continue her current physical therapy sessions until she can see Lahoma Rocker.  I will see her back for follow-up after her MRI is complete.        **Patient's current chronic lower back pain represents a chronic condition with continued instability and exacerbation, significantly impacting her daily activities and quality of life.                    Electronically Signed by Revonda Standard, MD  05/21/23  2:50 PM

## 2023-05-20 NOTE — Progress Notes (Deleted)
 [facility] Monique Barker  [address]   [place] [address]  Dept: [phone]      Physical Therapy Initial Assessment     Referring MD: Smitty Cords *  Diagnosis: No diagnosis found.   Surgery: n/a    Therapy precautions:{precautions:43047}  Co-morbidities affecting plan of care: ***    Payor: Payor: [place] [employer] /  /  /  Billing pattern: {Payor Guidelines:68174}  Total Timed Procedure Codes: *** min, Total Time: *** min Modifier needed: {Modifier:58146}  Episode visit count:  Visit count could not be calculated. Make sure you are using a visit which is associated with an episode.     PERTINENT MEDICAL HISTORY     Past medical and surgical history:   Past Medical History:   Diagnosis Date    Arthritis     Dyslipidemia     MRSA infection     x2 on leg last episode 4 years ago      Past Surgical History:   Procedure Laterality Date    ANESTH,SURGERY OF SHOULDER Right     arthroscopy    ORTHOPEDIC SURGERY      left ankle    PR UNLISTED PROCEDURE ABDOMEN PERITONEUM & OMENTUM      chole     Medications: reviewed in chart   Allergies:   Allergies   Allergen Reactions    Pravastatin Myalgia     Other reaction(s): Myalgia, Myalgia-Intolerance    Methylprednisolone Hives     Patient states this only happened once with a taper.  Taken prior stable doses for several days with no reaction. No prior reactions with steroid injections in the past.        Diagnostic exams (per chart review): X-rays including AP and lateral views of the lumbar spine were reviewed and interpreted:      Standing films of the thoracolumbar spine reveal significant right-sided coronal deformity with multiple levels of lateral listhesis in the lower lumbar spine.  Cobb angle 65 degrees.  Abundant osteophytes on both the concavity and convexity of the curve.  Stairstep right-sided lateral listhesis of L3-4 and L4-5.  Lateral views demonstrate abundant spondylosis but no visible anterolisthesis..     Radiographic Impression:      Scoliosis and lateral listhesis as noted above     MRI of the lumbar spine images were reviewed and independently interpreted from May 05, 2022: Significant coronal deformity and right-sided convexity.  Multiple levels of facet arthropathy, disc degeneration, most severe on the right side at L3-4 and L4-5.    SUBJECTIVE     Chief complaints/history of injury:  68 y.o. female who presents with a several year history of back pain.  Recently this has worsened to include the right hip and buttock.  She has been seen by several providers before including a deformity surgeon at [facility] in [place].  She was told by this previous provider that she needed surgery.  She has tried physical therapy in the past.  She also remains very active doing Pilates.  About a month ago, she had a right-sided L3-4, L4-5 transforaminal epidural steroid injection.  She reports that this gave her about 60% relief.  She is wanting to return to tennis and some other activities but is hesitant to for fear of worsening her condition.  She is otherwise healthy.  She is here today with her husband, Dr. Shaune Leeks who is an ophthalmologist.     Date symptoms began: ***  Ashby Dawes of  condition: {Nature of Condition:45004}  Primary cause of current episode: {Cause:44979}  How did symptoms start: ***  Describe current symptoms: ***    Received previous outpatient therapy? {Previous Treatment:46715}    Pain Assessment:  Pain location: ***    Average Pain/symptom intensity (0-10 scale)  Last 24 hours: ***/10  Last week (1-7 days): ***/10  How often do you feel symptoms? {frequency:45270}  Description: {pain description:43046}  Aggravating factors: {back-aggravating:43156}  Alleviating factors: {Back- alleviating:43158}    Neuro screen: {neuro screen:41884::"denies numbness, tingling, and radiating pain"}    Social/Functional Hx:  How would you rate your overall health? {health rating:43041}  Pt lives {lives with:43042} in a(n)  {home environment:43043} with {stairs/ramp:43044}.   Current DME: {DME Devices:43045}  Work Status: {work history:43061}   Sleep: {quality:43051}  PLOF & Social Hx/Interests: {PLOF:41886::"Independent and active without physical limitations","participated in ***"}  How much have your symptoms interfered with daily activities? {activity limitation:45269}  Current level of function: {current function:43103}    Patient Stated Goals: ***    OBJECTIVE EXAMINATION     Functional Outcome Questionnaire: Oswestry Low Back Pain Disability Questionnaire: ***/50= ***% functional deficit   Observation:   Posture: {POA PT back posture:43151}  Swelling/Edema: {swelling:43063}  Skin Integrity: {skin integrity:43064}   Palpation/joint mobility: ***    A/PROM Measures:     Lumbar  05/19/23 Comments   Flexion     Extension     R side bend     L side bend     R rotation     L rotation             Strength/MMT (0-5 Scale):     Right Left Comment   Hip Flexion      Hip Extension      Hip Abduction      Hip ER      Hip IR      Knee Extension      Knee Flexion      Ankle DF      Ankle PF      Trunk Flexion      Lower Abdominals              Special Tests/Function:   Gait: {gait analysis:43105}  Stair management:{stair management:43110}  Sit to stand: ***  Special tests:   {Back/hip special tests:43113}    Treatment provided today consisted of initial evaluation followed by:  Therapeutic exercise (97110) x *** min to address ROM/strength deficits and to develop an initial HEP as noted below.    Patient Education on the condition/pathology, involved anatomy, and exercise rationale.    CLINICAL DECISION MAKING/ASSESSMENT     Personal Factors/co-morbidities affecting POC (1-2 Medium/3+High): {personal factors:43038}   Problem List: (1-2 Low/ 3 Medium/ 4+ High) {Problem list:41879}    Clinical decision making: {clinical decision making:43040}  Prognosis: {rehab potential:43102}   Benefits and precautions of treatment explained to patient.  Anayeli Arel is a 67 y.o. female who presents to therapy today with {clinical presentation:41878} related to ***. Pt would benefit from skilled physical therapy services to address the deficits noted above for return to prior level of function.    PLAN OF CARE     Effective Dates/Duration: 05/20/2023 TO {POC END WUJW:11914}.    Frequency: {frequency:43050}   Interventions may include but are not limited to:   {interventions:44570}    The referring medical provider has reviewed and approved this evaluation and plan of care as noted by the electronic signature attached to note.  GOALS     Short term goals to be met by {ZOXW:96045}:  {Lumbar WUJW:11914}    Long term goals to be met by {POC END NWGN:56213}:  {POA PT lumbar LTGs:43216}    [facility]

## 2023-06-04 ENCOUNTER — Ambulatory Visit: Admit: 2023-06-04 | Discharge: 2023-06-04 | Payer: BLUE CROSS/BLUE SHIELD | Primary: Internal Medicine

## 2023-06-04 DIAGNOSIS — M545 Low back pain, unspecified: Secondary | ICD-10-CM

## 2023-06-11 ENCOUNTER — Encounter: Payer: BLUE CROSS/BLUE SHIELD | Primary: Internal Medicine

## 2023-06-18 ENCOUNTER — Ambulatory Visit: Admit: 2023-06-18 | Discharge: 2023-06-18 | Payer: BLUE CROSS/BLUE SHIELD | Primary: Internal Medicine

## 2023-06-18 DIAGNOSIS — M48061 Spinal stenosis, lumbar region without neurogenic claudication: Secondary | ICD-10-CM

## 2023-06-22 NOTE — Progress Notes (Signed)
 Name: Monique Barker  Date of Birth: November 23, 1955  Gender: female  MRN: [id number]  Age: 68 y.o.    Chief Complaint: RLE pain, radicular symptoms     Assessment/Plan at Previous Visit: rpt MRI L spine    History of Present Illness  The patient is a 68 year old female who presents for MRI results of the lumbar spine. She was last seen on 05/20/2023 and was experiencing lower back pain with some radiation in the lower extremities. It was determined that updating her imaging with a new lumbar spine MRI was the best next step.    She reports severe neck pain, which she attributes to an uncomfortable bed she used during a recent illness. Additionally, she describes a persistent deep ache in her right buttock, which she believes originates from her back. This pain disrupts her sleep, forcing her to change positions frequently. The pain radiates down her legs, particularly at night, and is constant, causing significant discomfort. She also reports difficulty walking for exercise and has noticed swelling in her ankles. She is scheduled to see a physical therapist next Tuesday. Currently, she is taking Voltaren once daily, which she tolerates well. She has a history of scoliosis, diagnosed over a decade ago, and recalls an incident in high school where a cheerleading stunt resulted in a heavy landing on her shoulders.    She has some circulation issues and experiences itching in her lower legs at night.    SOCIAL HISTORY  Exercise: Pilates, frequency not specified; walking, frequency not specified                 Medications:       Current Outpatient Medications:     Coenzyme Q10 100 MG TABS, Take by mouth, Disp: , Rfl:     Estradiol (VAGIFEM) 10 MCG TABS vaginal tablet, Insert one tablet vaginally twice weekly at night., Disp: , Rfl:     rosuvastatin (CRESTOR) 5 MG tablet, Take 5 mg by mouth, Disp: , Rfl:     CALCIUM PO, Take by mouth, Disp: , Rfl:     CRANBERRY PO, Take by mouth, Disp: , Rfl:     TURMERIC PO, Take by mouth  (Patient not taking: Reported on 10/25/2020), Disp: , Rfl:     ZINC PO, Take by mouth, Disp: , Rfl:     ascorbic acid (VITAMIN C) 500 MG tablet, Take by mouth daily (Patient not taking: Reported on 10/25/2020), Disp: , Rfl:     diclofenac (VOLTAREN) 75 MG EC tablet, Take by mouth, Disp: , Rfl:     fexofenadine (ALLEGRA) 180 MG tablet, Take by mouth, Disp: , Rfl:     hydroCHLOROthiazide (HYDRODIURIL) 12.5 MG tablet, Take 12.5 mg by mouth daily (Patient not taking: Reported on 10/25/2020), Disp: , Rfl:     montelukast (SINGULAIR) 10 MG tablet, Take 10 mg by mouth, Disp: , Rfl:     omeprazole (PRILOSEC) 20 MG delayed release capsule, Take 20 mg by mouth daily, Disp: , Rfl:     Allergies:  Allergies   Allergen Reactions    Pravastatin Myalgia     Other reaction(s): Myalgia, Myalgia-Intolerance    Methylprednisolone Hives     Patient states this only happened once with a taper.  Taken prior stable doses for several days with no reaction. No prior reactions with steroid injections in the past.    Other reaction(s): Hives/Swelling-Allergy   Patient states this only happened once with a taper.  Taken prior stable doses for several days with  no reaction. No prior reactions with steroid injections in the past.   Patient states this only happened once with a taper.  Taken prior stable doses for several days with no reaction. No prior reactions with steroid injections in the past.    Evolocumab      Other Reaction(s): Hives/Swelling-Allergy         Physical Exam:     The patient is well-developed and well nourished. Mood and cognition appear normal. Patient appears oriented to person/place/time. , No evidence of increased work of breathing on exam. , and Motor grossly intact in the BLE, no observable deficit. Ambulates without difficulty.         Results  Imaging   - MRI of the lumbar spine independently reviewed from 06/04/23: Chronic coronal deformity with stenosis at multiple levels, with the worst at L3-L4 and L4-5 affecting  the R sided.         Diagnosis:      ICD-10-CM    1. Spinal stenosis of lumbar region, unspecified whether neurogenic claudication present  M48.061 Ambulatory Referral to Spine Injection          Assessment & Plan  1. Lumbar spinal stenosis:  MRI results indicate significant stenosis at multiple levels, with the most severe at L3-L4. Symptoms include right buttock pain radiating to the front lower leg. These symptoms seem to follow a R sided L3-4 and L4-5 distribution, affecting primarily the L4 and L5 nerve roots respectively.     A right L3-L4 and L4-L5 transforaminal epidural steroid injection will be ordered. If symptoms persist, surgical intervention may be considered and discussed further, but we will certain try to avoid surgery if at all possible. Scheduled for physical therapy with Marcel Senter, PT next Tuesday to continue conservative approach to address symptoms. Anti-inflammatory medication, Voltaren, is taken once daily. Encourage continuing to take this for anti inflammatory benefit.     2. Cervicalgia:  Recent onset of neck pain with popping and cracking sounds, exacerbated by activities like Pilates.    Physical therapy with Marcel Senter is scheduled for next Tuesday to address these symptoms.    3. Hip bursitis:  Mild hip abduction weakness and instability on the right side, suggesting possible bursitis.    Strengthening exercises for the hip muscles are recommended. If symptoms persist, further injections may be considered.    Monitoring for any changes or worsening of symptoms is advised.    Follow-up in 6 weeks.      - Injection: The patient will be referred for a lumbar steroid injection to help reduce the symptoms. The patient understands the risks including hyperglycemia, immunosuppression, meningitis, cerebrospinal fluid leak, epidural hematoma, and reaction to medication. The patient may benefit from additional injections depending on the response. The injection should be a Right sided L3-4 and L4-5  TF ESI.            Electronically Signed by Jaymes Mew, MD  06/22/23  9:06 AM

## 2023-06-25 ENCOUNTER — Encounter
Admit: 2023-06-25 | Discharge: 2023-06-25 | Payer: BLUE CROSS/BLUE SHIELD | Attending: Rehabilitative and Restorative Service Providers" | Primary: Internal Medicine

## 2023-06-25 DIAGNOSIS — M5441 Lumbago with sciatica, right side: Secondary | ICD-10-CM

## 2023-06-25 NOTE — Progress Notes (Signed)
 [facility]  [address]  [place] [facility] [address]  Dept: [phone]      Physical Therapy Initial Assessment     Referring MD: Smitty Cords *  Diagnosis:     ICD-10-CM    1. Chronic right-sided low back pain with right-sided sciatica  M54.41     G89.29       2. Decreased activity tolerance  R68.89       3. Muscle weakness (generalized)  M62.81          Spine Surgery Date (if applicable):   Prior Spine Surgery: None    Prior Orthopedic Surgeries: ankle fracture  Therapy precautions:None  Co-morbidities affecting plan of care: None    Payor: Payor: [place] [employer] /  /  /     Billing pattern: Commercial- substantial/midpoint time each CPT  Total Timed Procedure Codes: 30 min   Total Time: 40 min    Modifier needed: No  Episode visit count:  1     PERTINENT MEDICAL HISTORY   Medications: reviewed in chart  Past medical and surgical history:   Past Medical History:   Diagnosis Date    Arthritis     Dyslipidemia     MRSA infection     x2 on leg last episode 4 years ago      Past Surgical History:   Procedure Laterality Date    ANESTH,SURGERY OF SHOULDER Right     arthroscopy    ORTHOPEDIC SURGERY      left ankle    PR UNLISTED PROCEDURE ABDOMEN PERITONEUM & OMENTUM      chole     Allergies:   Allergies   Allergen Reactions    Pravastatin Myalgia     Other reaction(s): Myalgia, Myalgia-Intolerance    Methylprednisolone Hives     Patient states this only happened once with a taper.  Taken prior stable doses for several days with no reaction. No prior reactions with steroid injections in the past.    Other reaction(s): Hives/Swelling-Allergy   Patient states this only happened once with a taper.  Taken prior stable doses for several days with no reaction. No prior reactions with steroid injections in the past.   Patient states this only happened once with a taper.  Taken prior stable doses for several days with no reaction. No prior reactions with steroid injections in the past.     Evolocumab      Other Reaction(s): Hives/Swelling-Allergy      Diagnostic exams (per chart review): MRI results indicate significant stenosis at multiple levels, with the most severe at L3-L4. Symptoms include right buttock pain radiating to the front lower leg. These symptoms seem to follow a R sided L3-4 and L4-5 distribution, affecting primarily the L4 and L5 nerve roots respectively.      SUBJECTIVE   Reports some days pain is not as bad.     Patient Stated Goals: ideas of exercises to help me manage my pain daily and help to stop progression  Home program compliance:  will issue one today    Received previous outpatient therapy? Yes; in the last February. On and off for a while  Chief complaints/history of injury:  Date symptoms began: 2024  Monique Barker of condition: Chronic (continuous duration > 3 months)  Primary cause of current episode: Unspecified  History of Present Illness: How did symptoms start: Unknown cause. She did report having back pain for years and has gotten worse in the last year or  so.  Reports pain radiates to the R leg. Does not go below the knee. Radiating pain with prolonged sitting, Pain is bilateral lumbar paraspinals, worse on R.   Described current symptoms as: numb, ache, dull, spasms    Pain Assessment:  Pain location: R worse than L, Bilateral Low Back. Radiating to R posterior leg  Average Pain/symptom intensity (0-10 scale)  Last 24 hours: 5/10  Last week (1-7 days): 5/10  Description: aching, dull, and numb  Aggravating factors: rolling over in bed, prolonged standing, and walking  Alleviating factors: sitting, lying down, and ice  How often do you feel symptoms? Frequently (51-75%)  How much have your symptoms interfered with daily activities? Quite a bit  Sleep: minimally disturbed (less than 6 hours sleep) and wakes 4-5 times/night    Social/Functional Hx:  How would you rate your overall health? good  Pt lives with independent spouse in a(n) 2+ story house with  stairs .   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 Difficulty/pain with bending over - pick up a light object off floor  - flexion to extension 1/10     2. Difficulty/pain with walking tolerance >15 minutes 1/10     3. Difficulty/pain with bed mobility, supine <-> sit, rolling over in bed, finding a comfortable position  1/10     4. Difficulty/pain with side lying   1/10     Average score:  4/4      1     Interpretation of Score: The Patient-Specific functional scale is used to quantify activity limitation and measure functional outcome for patients with any orthopaedic condition. Each activity is scored 0-10, 0 representing unable to perform activity and 10 able to perform activity at the same level as before injury or problem. Minimum detectable change is 2 points for average score and 3 points for single activity score.       OBJECTIVE EXAMINATION     Pain relieving position: flexion, SB L rotation R   Functional Outcome Questionnaire:   Modified Oswestry Low Back Pain Disability Questionnaire: 13/50= 26% functional deficit     Observation/Palpation:    Posture: Increased thoracic kyphosis, Decreased lumbar lordosis, and anteriorly translated pelvis  Cervical: decreased lordosis, forward head    Scoliosis: R (+) posterior rib hump  Thoracic curve: RIGHT. Lumbar curve: LEFT. Concave: LEFT   Thoracic:  Hypomobile. Flexed  Pelvis: Rotated RIGHT  RIGHT Posterior Rotation   RIGHT flare: OUT  Innominate:  RIGHT ASIS higher than LEFT   Sacrum: Extended (+) sacral sitting  Knee: Tibial Torsion: RIGHT internal  Hip: RIGHT: Femur in IR    Foot: Supinated RIGHT   Swelling/Edema: none  Skin Integrity: normal   Muscle Imbalance/Weakness:  RIGHT > LEFT: Hip flexors, Glut med, max, pelvic rotators, quad, hams, paraspinals  Soft Tissue Tightness: LEFT Hip flexors, hams, gastroc soleus, paraspinals    AROM:  (within pain tolerance)   COMMENTS   LUMBAR  06/25/23      FLEX: (40  60)  20          Pain  and Tightness at [facility]   EXT: (20  35 )  10          Pain and Tightness at [facility]    RIGHT LEFT RIGHT LEFT    Side Bending (15  20)  10  15                Pain and Tightness at [facility]   Rotation (3- 18  )  5  5                Pain and Tightness at [facility]   Thoracic SB  Limited  Limited   Tightness at [facility]   Thoracic ROT Limited Limited   Tightness at [facility]   Hip External Rot Limited Limited   Tightness at [facility]   Hip Internal Rot Limited Limited   Tightness at [facility]     GROSS MMT:  LUMBAR/HIP   (within pain tolerance) 06/25/23    COMMENTS   FLEXION 3/5       EXTENSION 3/5        RIGHT LEFT   RIGHT   LEFT    SIDE BENDING 3/5 3/5      ROTATION 3/5 3/5       RIGHT LEFT   RIGHT    LEFT    Hip EXTENSION 3/5 3/5      Hip ABDUCTION 3/5 3/5      Hip INTERNAL ROT 3/5 3/5      HIP EXTERNAL ROT 3/5 3/5        Palpation/Joint Mobility:   LUMBAR JOINT MOBILITY     COMMENTS   L1 hypomobile    L2 hypomobile       L3 hypomobile    L4 hypomobile    L5 hypomobile    TL Junction hypomobile    SACRUM  hypomobile    Hypertonic lumbar paraspinals noted  No TTP to lumbar paraspinals noted     DERMATOME/MYOTOME:  WFL  REFLEXES: WNL    Neuro Screen: denies numbness, tingling, and radiating pain  Bowel/bladder function: normal   Sensory/Abdominal/saddle sensation: normal    Function:   Gait: no gross deviations.   Stair management:    WNL as per patient report.  Sit to stand: Shifts weight to LEFT, Leans to LEFT   Bed Mobility: Guarded    Special Tests:  SLR's Test:  Negative  Heel Walking (L4-L5): Negative  Toe Walking (S1-S2): Negative   Prone Instability Test: Positive  Passive Lumbar Extension Test: Negative      Treatment provided today consisted of initial evaluation  followed by:  Therapeutic exercise (97110) x 30 min to address ROM/strength deficits and to develop an initial HEP as noted below.  Current Exercises Past Exercises (not performed today)   Child's pose lengthen R, SB L rotation R  As above: in standing  Reviewed and issued written HEP. Demonstrates independence in performance        Patient Education on the condition/pathology, involved anatomy, and exercise rationale.  MOVEMENT/HOME/ADL  MODIFICATION:  stretch R side during [employer]    CLINICAL DECISION MAKING/ASSESSMENT   Personal Factors/co-morbidities affecting POC (1-2 Medium/3+High): no factors involved   Problem List: (1-2 Low/ 3 Medium/ 4+ High) Pain  ROM limitations  Hypermobility/instability  Soft tissue restrictions  Strength deficits  Impaired posture  Decreased endurance/activity Tolerance  Self care difficulty  ADL/functional limitations/modifications  Decreased Independence with Home Exercise Program   Hypomobility   Clinical decision making: moderate complexity with questionable prediction of expectations  and future outcomes which may require adjustments to the POC.  Prognosis: good   Benefits and precautions of treatment plan explained to patient. Verbalized and demonstrates understanding.   Monique Barker is a 68 y.o. female who presents to therapy today with evolving/changing clinical presentation (moderate complexity)  related to radiating R back pain. No antalgia. Trendelenburg gait noted on RIGHT. Pt with significant weakness to bilateral glut med worse on RIGHT than LEFT. Pt also with limitation of motion with extension > forward flexion. Hypomobile LS junction noted. Decreased lumbar lordosis noted with posterior rotation noted on RIGHT. Patient with anteriorly translated pelvis. Pt would benefit from skilled physical therapy services to address the deficits noted above for return to prior level of function.    PLAN OF CARE   Review and update written HEP next visit. Manual therapy  as appropriate. Progress to site specific strengthening/stabilization exercises as tolerated.   Effective Dates/Duration: 06/25/2023 TO 09/23/2023 (90 days).    Frequency:  1-2 per week    Interventions may include but are not limited to:   (16109) Therapeutic exercise to develop ROM, strength, endurance and flexibility  (97530) Therapeutic activities using dynamic activities to improve function  (97140) Manual therapy techniques to improve joint and/or soft tissue mobility, ROM, and function as well as helping to decrease pain/spasms and swelling  (60454) Neuromuscular reeducation addressing impaired balance, coordination, kinesthetic sense, posture and proprioception  (09811) Self-care/home management training to improve activities of daily living skills and compensatory techniques to achieve independence  (91478) Mechanical traction to address spinal/nerve/disc compression, improve joint mobility, and/or soft tissue flexibility  (20560/20561) Dry needling for the management of neuromusculoskeletal pain and movement impairment  Modalities prn to address pain, spasms, and swelling: (29562/Z3086) Electrical stimulation- unattended    The referring medical provider has reviewed and approved this evaluation and plan of care as noted by the electronic signature attached to note.    GOALS   Short term goals to be met by 08/06/2023  (6 weeks):  1. Pt will have a MMT improvement in strength to lumbar paraspinals and B LE by 1/2 grade to allow for improved ADL performance.   2. Pt will report a 30% - 50% improvement in back pain with self care activities, ADL's   3. Pt will report/demonstrate an improvement of standing tolerance to 15 minutes or greater to allow for ADL's such as cooking, washing, personal care.  4. Pt will tolerate improved walking tolerance by 15 minutes or greater to allow for grocery shopping and community independence.  5. Pt will have an improved ability to bend over and pick up a light object, don  shoes/socks on and off      Long term goals to be met by 09/23/2023 (90 days):  1. Pt will report 50%-80% or greater improvement in back pain with self care activities, ADL's.  2. Pt will report/demonstrate an improvement of back pain with side lying to allow for restful sleep.   3. Pt will tolerate improved walking tolerance by 30 minutes or greater to allow for grocery shopping and community independence.  4.Pt will have an improved ability to bend over and pick up a light weight/objects with min to manageable back and or PLOF.   5. Pt will report improvement in sleeping tolerance as evidenced by reduced wake up times especially with bed mobility, rolling over in bed with manageable back/hip pain and or PLOF.   6. Pt will report independence with min to manageable back pain with basic  home management to allow for improved laundry management and ADL's.  7. Pt will be independent with updated and modified HEP.  8. Pt will be independent with pain management strategies.      [employer]

## 2023-07-01 ENCOUNTER — Ambulatory Visit
Admit: 2023-07-01 | Discharge: 2023-07-01 | Payer: BLUE CROSS/BLUE SHIELD | Attending: Physical Medicine & Rehabilitation | Primary: Internal Medicine

## 2023-07-01 ENCOUNTER — Ambulatory Visit: Admit: 2023-07-01 | Payer: BLUE CROSS/BLUE SHIELD | Primary: Internal Medicine

## 2023-07-01 DIAGNOSIS — M48061 Spinal stenosis, lumbar region without neurogenic claudication: Secondary | ICD-10-CM

## 2023-07-01 MED ORDER — TRIAMCINOLONE ACETONIDE 40 MG/ML IJ SUSP
40 | Freq: Once | INTRAMUSCULAR | Status: AC
Start: 2023-07-01 — End: 2023-07-01
  Administered 2023-07-01: 14:00:00 40 mg

## 2023-07-01 NOTE — Progress Notes (Signed)
 Name: Monique Barker  Date of Birth: 03-17-1955  Gender: female  MRN: [id number]        Transforaminal ESI Procedure Note    Procedure: Right  L3-L4 and L4-L5 transforaminal epidural steroid injections     Precautions: [name] reports prior sensitivity to steroid, local anesthetic, iodine, or shellfish. She has previously had a problem with oral steroids but has tolerated steroid injections without difficulties/complications.      Consent:  Consent was obtained prior to the procedure. The procedure was discussed at length with [name]. She was given the opportunity to ask questions regarding the procedure and its associated risks.  In addition to the potential risks associated with the procedure itself, the patient was informed both verbally and in writing of potential side effects of the use glucocorticoids.  The patient appeared to comprehend the informed consent and desired to have the procedure performed, and informed consent was signed.     She was placed in a prone position on the fluoroscopy table and the skin was prepped and draped in a routine sterile fashion. The areas to be injected were each anesthetized with 1 ml of 1% Lidocaine. A 22 gauge 3.5 inch spinal needle was carefully advanced under fluoroscopic guidance to the right L4-L5 transforaminal space  0.5 ml of 70% of Omnipaque was injected to confirm proper needle placement and absence of subdural or vascular flow Once proper placement was confirmed, 0.5ml of 0.25 marcaine and 40 mg kenalog  were injected through the spinal needle.       The above procedure was then repeated at the Right  L3-L4 transforaminal space.    Fluoroscopic guidance was used intermittently over a 10-minute period to insure proper needle placement and her safety. A hard copy of the fluoroscopic image has been placed in her chart and is saved on the C-arm hard drive. She was monitored for 30 minutes after the procedure and discharged home in a stable  fashion with a routine follow up.    Procedural Diagnosis:     ICD-10-CM    1. Spinal stenosis of lumbar region, unspecified whether neurogenic claudication present  M48.061 FL NERVE BLOCK LUMBOSACRAL 1ST     FL NERVE BLOCK LUMBOSACRAL EACH ADD     triamcinolone  acetonide (KENALOG -40) injection 40 mg         Avaeh Ewer P Glorya Bartley, MD  07/01/23

## 2023-07-03 ENCOUNTER — Encounter
Admit: 2023-07-03 | Discharge: 2023-07-03 | Payer: BLUE CROSS/BLUE SHIELD | Attending: Rehabilitative and Restorative Service Providers" | Primary: Internal Medicine

## 2023-07-03 DIAGNOSIS — G8929 Other chronic pain: Secondary | ICD-10-CM

## 2023-07-03 NOTE — Progress Notes (Signed)
 [name]  [address]  [place] [facility] [address]  Dept: [phone]      Physical Therapy Daily Note     Referring MD: Cornelius Dill *  Diagnosis:     ICD-10-CM    1. Chronic right-sided low back pain with right-sided sciatica  M54.41     G89.29       2. Decreased activity tolerance  R68.89       3. Muscle weakness (generalized)  M62.81         Spine Surgery Date (if applicable):   Prior Spine Surgery: None    Prior Orthopedic Surgeries: ankle fracture  Therapy precautions:None  Co-morbidities affecting plan of care: None    Payor: Payor: [place] [employer] /  /  /     Billing pattern: Commercial- substantial/midpoint time each CPT  Total Timed Procedure Codes: 40 min   Total Time: 40 min    Modifier needed: No  Episode visit count:  2     PERTINENT MEDICAL HISTORY   Medications: reviewed in chart  Past medical and surgical history:   Past Medical History:   Diagnosis Date    Arthritis     Dyslipidemia     MRSA infection     x2 on leg last episode 4 years ago      Past Surgical History:   Procedure Laterality Date    ANESTH,SURGERY OF SHOULDER Right     arthroscopy    ORTHOPEDIC SURGERY      left ankle    PR UNLISTED PROCEDURE ABDOMEN PERITONEUM & OMENTUM      chole     Allergies:   Allergies   Allergen Reactions    Pravastatin Myalgia     Other reaction(s): Myalgia, Myalgia-Intolerance    Methylprednisolone Hives     Patient states this only happened once with a taper.  Taken prior stable doses for several days with no reaction. No prior reactions with steroid injections in the past.    Other reaction(s): Hives/Swelling-Allergy   Patient states this only happened once with a taper.  Taken prior stable doses for several days with no reaction. No prior reactions with steroid injections in the past.   Patient states this only happened once with a taper.  Taken prior stable doses for several days with no reaction. No prior reactions with steroid injections in the past.    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 Symptoms include right buttock pain radiating to the front lower leg. These symptoms seem to follow a R sided L3-4 and L4-5 distribution, affecting primarily the L4 and L5 nerve roots respectively.     History of Present Illness: How did symptoms start: Unknown cause. She did report having back pain for years and has gotten worse in the last year or so.  Reports pain radiates to the R leg. Does not go below the knee. Radiating pain with prolonged sitting, Pain is bilateral lumbar paraspinals, worse on R.   Described current symptoms as: numb, ache, dull, spasms     SUBJECTIVE   Reports she is unable to sleep to side lying.     Patient Stated Goals: ideas of exercises to help me manage my pain daily and help to stop progression  Home program compliance: Completing as prescribed    Received previous outpatient therapy?  Yes; in the last February. On and off for a while  Chief complaints/history of injury:  Date symptoms began: 2024  Lonne Roan of condition: Chronic (continuous duration > 3 months)  Primary cause of current episode: Unspecified    Pain Assessment:  Pain location: R worse than L, Bilateral Low Back. Radiating to R posterior leg  Average Pain/symptom intensity (0-10 scale)  Last 24 hours: 5/10  Last week (1-7 days): 5/10  Description: aching, dull, and numb  Aggravating factors: rolling over in bed, prolonged standing, and walking  Alleviating factors: sitting, lying down, and ice  How often do you feel symptoms? Frequently (51-75%)  How much have your symptoms interfered with daily activities? Quite a bit  Sleep: minimally disturbed (less than 6 hours sleep) and wakes 4-5 times/night    Prior Level Of Function: Independent and active without physical limitations and participated in self directed walking program, play tennis  Current  Level Of Function:   The Patient-Specific Functional Scale:         (0 - unable             10 - able  to perform activity)  Activity: 06/25/23     1. Difficulty/pain with bending over - pick up a light object off floor  - flexion to extension 1/10     2. Difficulty/pain with walking tolerance >15 minutes 1/10     3. Difficulty/pain with bed mobility, supine <-> sit, rolling over in bed, finding a comfortable position  1/10     4. Difficulty/pain with side lying   1/10     Average score:  4/4      1     Interpretation of Score: The Patient-Specific functional scale is used to quantify activity limitation and measure functional outcome for patients with any orthopaedic condition. Each activity is scored 0-10, 0 representing unable to perform activity and 10 able to perform activity at the same level as before injury or problem. Minimum detectable change is 2 points for average score and 3 points for single activity score.       OBJECTIVE EXAMINATION     Pain relieving position: flexion, SB L rotation R   Functional Outcome Questionnaire:   Modified Oswestry Low Back Pain Disability Questionnaire: 13/50= 26% functional deficit     Observation/Palpation:    Posture: Increased thoracic kyphosis, Decreased lumbar lordosis, and anteriorly translated pelvis  Cervical: decreased lordosis, forward head    Scoliosis: R (+) posterior rib hump  Thoracic curve: RIGHT. Lumbar curve: LEFT. Concave: LEFT   Thoracic:  Hypomobile. Flexed  Pelvis: Rotated RIGHT  RIGHT Posterior Rotation   RIGHT flare: OUT  Innominate:  RIGHT ASIS higher than LEFT   Sacrum: Extended (+) sacral sitting  Knee: Tibial Torsion: RIGHT internal  Hip: RIGHT: Femur in IR    Foot: Supinated RIGHT   Muscle Imbalance/Weakness: RIGHT > LEFT: Hip flexors, Glut med, max, pelvic rotators, quad, hams, paraspinals  Soft Tissue Tightness: LEFT Hip flexors, hams, gastroc soleus, paraspinals    Neuro Screen: denies numbness, tingling, and radiating pain  Function:   Gait:  no gross deviations.   Stair management:    WNL as per patient report.  Sit to stand: Shifts weight to LEFT, Leans to LEFT   Bed Mobility: Guarded    Special Tests:  SLR's Test:  Negative  Heel Walking (L4-L5): Negative  Toe Walking (S1-S2): Negative   Prone Instability Test: Positive  Passive Lumbar Extension Test: Negative    Treatment provided  today consisted of:   Therapeutic exercise (97110) x 40 min to address ROM/strength deficits and to develop an initial HEP as noted below.  Current Exercises Past Exercises (not performed today)   Child's pose lengthen R, SB L rotation R  Cat and camel from sacrum   On elbows: hip extension  S/L: shoulder L flexion   S/L: hip rotation to ER with one leg  S/L: thoracic rotations   Quadruped: rotations to thoracic - fist on L   SKTC on R with pelvic tilt  Leg hangs hold x 2-5 sec   Grade 1 mobs to lumbar spine   Reviewed and issued written HEP. Demonstrates independence in performance        Patient Education on the condition/pathology, involved anatomy, and exercise rationale.  MOVEMENT/HOME/ADL  MODIFICATION:  stretch R side during [employer]    ASSESSMENT   Reports some soreness in the back hip areas.     PLAN    Review and update written HEP next visit. Manual therapy as appropriate. Progress to site specific strengthening/stabilization exercises as tolerated.   Effective Dates/Duration: 06/25/2023 TO 09/23/2023 (90 days).    Frequency:  1-2 per week      GOALS   Short term goals to be met by 08/06/2023  (6 weeks):  1. Pt will have a MMT improvement in strength to lumbar paraspinals and B LE by 1/2 grade to allow for improved ADL performance.   2. Pt will report a 30% - 50% improvement in back pain with self care activities, ADL's   3. Pt will report/demonstrate an improvement of standing tolerance to 15 minutes or greater to allow for ADL's such as cooking, washing, personal care.  4. Pt will tolerate improved walking tolerance by 15 minutes or greater to allow for  grocery shopping and community independence.  5. Pt will have an improved ability to bend over and pick up a light object, don shoes/socks on and off      Long term goals to be met by 09/23/2023 (90 days):  1. Pt will report 50%-80% or greater improvement in back pain with self care activities, ADL's.  2. Pt will report/demonstrate an improvement of back pain with side lying to allow for restful sleep.   3. Pt will tolerate improved walking tolerance by 30 minutes or greater to allow for grocery shopping and community independence.  4.Pt will have an improved ability to bend over and pick up a light weight/objects with min to manageable back and or PLOF.   5. Pt will report improvement in sleeping tolerance as evidenced by reduced wake up times especially with bed mobility, rolling over in bed with manageable back/hip pain and or PLOF.   6. Pt will report independence with min to manageable back pain with basic home management to allow for improved laundry management and ADL's.  7. Pt will be independent with updated and modified HEP.  8. Pt will be independent with pain management strategies.      [facility]

## 2023-07-08 ENCOUNTER — Encounter
Payer: BLUE CROSS/BLUE SHIELD | Attending: Rehabilitative and Restorative Service Providers" | Primary: Internal Medicine

## 2023-07-12 ENCOUNTER — Encounter
Payer: BLUE CROSS/BLUE SHIELD | Attending: Rehabilitative and Restorative Service Providers" | Primary: Internal Medicine

## 2023-07-15 ENCOUNTER — Encounter
Admit: 2023-07-15 | Discharge: 2023-07-15 | Payer: BLUE CROSS/BLUE SHIELD | Attending: Rehabilitative and Restorative Service Providers" | Primary: Internal Medicine

## 2023-07-15 DIAGNOSIS — M5441 Lumbago with sciatica, right side: Secondary | ICD-10-CM

## 2023-07-15 NOTE — Progress Notes (Signed)
 [facility]  [address]  [place] [facility] [address]  Dept: [phone]      Physical Therapy Daily Note     Referring MD: Cornelius Dill *  Diagnosis:     ICD-10-CM    1. Chronic right-sided low back pain with right-sided sciatica  M54.41     G89.29       2. Decreased activity tolerance  R68.89       3. Muscle weakness (generalized)  M62.81           Spine Surgery Date (if applicable):   Prior Spine Surgery: None    Prior Orthopedic Surgeries: ankle fracture  Therapy precautions:None  Co-morbidities affecting plan of care: None    Payor: Pay[place] [employer] /  /  /     Billing pattern: Commercial- substantial/midpoint time each CPT  Total Timed Procedure Codes: 40 min   Total Time: 40 min    Modifier needed: No  Episode visit count:  3     PERTINENT MEDICAL HISTORY   Medications: reviewed in chart  Past medical and surgical history:   Past Medical History:   Diagnosis Date    Arthritis     Dyslipidemia     MRSA infection     x2 on leg last episode 4 years ago      Past Surgical History:   Procedure Laterality Date    ANESTH,SURGERY OF SHOULDER Right     arthroscopy    ORTHOPEDIC SURGERY      left ankle    PR UNLISTED PROCEDURE ABDOMEN PERITONEUM & OMENTUM      chole     Allergies:   Allergies   Allergen Reactions    Pravastatin Myalgia     Other reaction(s): Myalgia, Myalgia-Intolerance    Methylprednisolone Hives     Patient states this only happened once with a taper.  Taken prior stable doses for several days with no reaction. No prior reactions with steroid injections in the past.    Other reaction(s): Hives/Swelling-Allergy   Patient states this only happened once with a taper.  Taken prior stable doses for several days with no reaction. No prior reactions with steroid injections in the past.   Patient states this only happened once with a taper.  Taken prior stable doses for several days with no reaction. No prior reactions with steroid injections in the past.    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 Symptoms include right buttock pain radiating to the front lower leg. These symptoms seem to follow a R sided L3-4 and L4-5 distribution, affecting primarily the L4 and L5 nerve roots respectively.     History of Present Illness: How did symptoms start: Unknown cause. She did report having back pain for years and has gotten worse in the last year or so.  Reports pain radiates to the R leg. Does not go below the knee. Radiating pain with prolonged sitting, Pain is bilateral lumbar paraspinals, worse on R.   Described current symptoms as: numb, ache, dull, spasms     SUBJECTIVE   Reports was hurting after doing more last weekend.     Patient Stated Goals: ideas of exercises to help me manage my pain daily and help to stop progression  Home program compliance: Completing as prescribed    Received previous outpatient  therapy? Yes; in the last February. On and off for a while  Chief complaints/history of injury:  Date symptoms began: 2024  Monique Barker of condition: Chronic (continuous duration > 3 months)  Primary cause of current episode: Unspecified    Pain Assessment:  Pain location: R worse than L, Bilateral Low Back. Radiating to R posterior leg  Average Pain/symptom intensity (0-10 scale)  Last 24 hours: 5/10  Last week (1-7 days): 5/10  Description: aching, dull, and numb  Aggravating factors: rolling over in bed, prolonged standing, and walking  Alleviating factors: sitting, lying down, and ice  How often do you feel symptoms? Frequently (51-75%)  How much have your symptoms interfered with daily activities? Quite a bit  Sleep: minimally disturbed (less than 6 hours sleep) and wakes 4-5 times/night    Prior Level Of Function: Independent and active without physical limitations and participated in self directed walking program, play  tennis  Current Level Of Function:   The Patient-Specific Functional Scale:         (0 - unable             10 - able  to perform activity)  Activity: 06/25/23     1. Difficulty/pain with bending over - pick up a light object off floor  - flexion to extension 1/10     2. Difficulty/pain with walking tolerance >15 minutes 1/10     3. Difficulty/pain with bed mobility, supine <-> sit, rolling over in bed, finding a comfortable position  1/10     4. Difficulty/pain with side lying   1/10     Average score:  4/4      1     Interpretation of Score: The Patient-Specific functional scale is used to quantify activity limitation and measure functional outcome for patients with any orthopaedic condition. Each activity is scored 0-10, 0 representing unable to perform activity and 10 able to perform activity at the same level as before injury or problem. Minimum detectable change is 2 points for average score and 3 points for single activity score.       OBJECTIVE EXAMINATION     Pain relieving position: flexion, SB L rotation R   Functional Outcome Questionnaire:   Modified Oswestry Low Back Pain Disability Questionnaire: 13/50= 26% functional deficit     Observation/Palpation:    Posture: Increased thoracic kyphosis, Decreased lumbar lordosis, and anteriorly translated pelvis  Cervical: decreased lordosis, forward head    Scoliosis: R (+) posterior rib hump  Thoracic curve: RIGHT. Lumbar curve: LEFT. Concave: LEFT   Thoracic:  Hypomobile. Flexed  Pelvis: Rotated RIGHT  RIGHT Posterior Rotation   RIGHT flare: OUT  Innominate:  RIGHT ASIS higher than LEFT   Sacrum: Extended (+) sacral sitting  Knee: Tibial Torsion: RIGHT internal  Hip: RIGHT: Femur in IR    Foot: Supinated RIGHT   Muscle Imbalance/Weakness: RIGHT > LEFT: Hip flexors, Glut med, max, pelvic rotators, quad, hams, paraspinals  Soft Tissue Tightness: LEFT Hip flexors, hams, gastroc soleus, paraspinals    Neuro Screen: denies numbness, tingling, and radiating  pain  Function:   Gait: no gross deviations.   Stair management:    WNL as per patient report.  Sit to stand: Shifts weight to LEFT, Leans to LEFT   Bed Mobility: Guarded    Special Tests:  SLR's Test:  Negative  Heel Walking (L4-L5): Negative  Toe Walking (S1-S2): Negative   Prone Instability Test: Positive  Passive Lumbar Extension Test: Negative    Treatment  provided today consisted of:   Therapeutic exercise (97110) x 40 min to address ROM/strength deficits and to develop an initial HEP as noted below.  Current Exercises Past Exercises (not performed today)   Child's pose lengthen R, SB L rotation R  Cat and camel from sacrum   On elbows: hip extension  S/L: shoulder L flexion   S/L: hip rotation to ER with one leg  S/L: thoracic rotations   Quadruped: rotations to thoracic - fist on L   SKTC on R with pelvic tilt  Leg hangs hold x 2-5 sec   Grade 1 mobs to lumbar spine   Reviewed and issued written HEP. Demonstrates independence in performance        Patient Education on the condition/pathology, involved anatomy, and exercise rationale.  MOVEMENT/HOME/ADL  MODIFICATION:  stretch R side during [employer] Pilates    ASSESSMENT   Reports min help with injection. Tight B hip flexors noted, L worse than R.      PLAN    Review and update written HEP next visit. Manual therapy as appropriate. Progress to site specific strengthening/stabilization exercises as tolerated.   Effective Dates/Duration: 06/25/2023 TO 09/23/2023 (90 days).    Frequency:  1-2 per week      GOALS   Short term goals to be met by 08/06/2023  (6 weeks):  1. Pt will have a MMT improvement in strength to lumbar paraspinals and B LE by 1/2 grade to allow for improved ADL performance.   2. Pt will report a 30% - 50% improvement in back pain with self care activities, ADL's   3. Pt will report/demonstrate an improvement of standing tolerance to 15 minutes or greater to allow for ADL's such as cooking, washing, personal care.  4. Pt will tolerate improved  walking tolerance by 15 minutes or greater to allow for grocery shopping and community independence.  5. Pt will have an improved ability to bend over and pick up a light object, don shoes/socks on and off      Long term goals to be met by 09/23/2023 (90 days):  1. Pt will report 50%-80% or greater improvement in back pain with self care activities, ADL's.  2. Pt will report/demonstrate an improvement of back pain with side lying to allow for restful sleep.   3. Pt will tolerate improved walking tolerance by 30 minutes or greater to allow for grocery shopping and community independence.  4.Pt will have an improved ability to bend over and pick up a light weight/objects with min to manageable back and or PLOF.   5. Pt will report improvement in sleeping tolerance as evidenced by reduced wake up times especially with bed mobility, rolling over in bed with manageable back/hip pain and or PLOF.   6. Pt will report independence with min to manageable back pain with basic home management to allow for improved laundry management and ADL's.  7. Pt will be independent with updated and modified HEP.  8. Pt will be independent with pain management strategies.      [facility]

## 2023-07-19 ENCOUNTER — Encounter
Admit: 2023-07-19 | Discharge: 2023-07-19 | Payer: BLUE CROSS/BLUE SHIELD | Attending: Rehabilitative and Restorative Service Providers" | Primary: Internal Medicine

## 2023-07-19 DIAGNOSIS — M5441 Lumbago with sciatica, right side: Secondary | ICD-10-CM

## 2023-07-19 NOTE — Progress Notes (Signed)
 [name]  [address]  [facility] [address]  Dept: [phone]      Physical Therapy Daily Note     Referring MD: Cornelius Dill *  Diagnosis:     ICD-10-CM    1. Chronic right-sided low back pain with right-sided sciatica  M54.41     G89.29       2. Decreased activity tolerance  R68.89       3. Muscle weakness (generalized)  M62.81             Spine Surgery Date (if applicable):   Prior Spine Surgery: None    Prior Orthopedic Surgeries: ankle fracture  Therapy precautions:None  Co-morbidities affecting plan of care: None    Payor: Payor: [place] [employer] /  /  /     Billing pattern: Commercial- substantial/midpoint time each CPT  Total Timed Procedure Codes: 40 min   Total Time: 40 min    Modifier needed: No  Episode visit count:  4     PERTINENT MEDICAL HISTORY   Medications: reviewed in chart  Past medical and surgical history:   Past Medical History:   Diagnosis Date    Arthritis     Dyslipidemia     MRSA infection     x2 on leg last episode 4 years ago      Past Surgical History:   Procedure Laterality Date    ANESTH,SURGERY OF SHOULDER Right     arthroscopy    ORTHOPEDIC SURGERY      left ankle    PR UNLISTED PROCEDURE ABDOMEN PERITONEUM & OMENTUM      chole     Allergies:   Allergies   Allergen Reactions    Pravastatin Myalgia     Other reaction(s): Myalgia, Myalgia-Intolerance    Methylprednisolone Hives     Patient states this only happened once with a taper.  Taken prior stable doses for several days with no reaction. No prior reactions with steroid injections in the past.    Other reaction(s): Hives/Swelling-Allergy   Patient states this only happened once with a taper.  Taken prior stable doses for several days with no reaction. No prior reactions with steroid injections in the past.   Patient states this only happened once with a taper.  Taken prior stable doses for several days with no reaction. No prior reactions with steroid injections in the past.    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 Symptoms include right buttock pain radiating to the front lower leg. These symptoms seem to follow a R sided L3-4 and L4-5 distribution, affecting primarily the L4 and L5 nerve roots respectively.     History of Present Illness: How did symptoms start: Unknown cause. She did report having back pain for years and has gotten worse in the last year or so.  Reports pain radiates to the R leg. Does not go below the knee. Radiating pain with prolonged sitting, Pain is bilateral lumbar paraspinals, worse on R.   Described current symptoms as: numb, ache, dull, spasms     SUBJECTIVE   Reports she is doing well. Back is better.     Patient Stated Goals: ideas of exercises to help me manage my pain daily and help to stop progression  Home program compliance: Completing as prescribed    Received  previous outpatient therapy? Yes; in the last February. On and off for a while  Chief complaints/history of injury:  Date symptoms began: 2024  Lonne Roan of condition: Chronic (continuous duration > 3 months)  Primary cause of current episode: Unspecified    Pain Assessment:  Pain location: R worse than L, Bilateral Low Back. Radiating to R posterior leg  Average Pain/symptom intensity (0-10 scale)  Last 24 hours: 5/10  Last week (1-7 days): 5/10  Description: aching, dull, and numb  Aggravating factors: rolling over in bed, prolonged standing, and walking  Alleviating factors: sitting, lying down, and ice  How often do you feel symptoms? Frequently (51-75%)  How much have your symptoms interfered with daily activities? Quite a bit  Sleep: minimally disturbed (less than 6 hours sleep) and wakes 4-5 times/night    Prior Level Of Function: Independent and active without physical limitations and participated in self directed walking program, play tennis  Current  Level Of Function:   The Patient-Specific Functional Scale:         (0 - unable             10 - able  to perform activity)  Activity: 06/25/23     1. Difficulty/pain with bending over - pick up a light object off floor  - flexion to extension 1/10     2. Difficulty/pain with walking tolerance >15 minutes 1/10     3. Difficulty/pain with bed mobility, supine <-> sit, rolling over in bed, finding a comfortable position  1/10     4. Difficulty/pain with side lying   1/10     Average score:  4/4      1     Interpretation of Score: The Patient-Specific functional scale is used to quantify activity limitation and measure functional outcome for patients with any orthopaedic condition. Each activity is scored 0-10, 0 representing unable to perform activity and 10 able to perform activity at the same level as before injury or problem. Minimum detectable change is 2 points for average score and 3 points for single activity score.       OBJECTIVE EXAMINATION     Pain relieving position: flexion, SB L rotation R   Functional Outcome Questionnaire:   Modified Oswestry Low Back Pain Disability Questionnaire: 13/50= 26% functional deficit     Observation/Palpation:    Posture: Increased thoracic kyphosis, Decreased lumbar lordosis, and anteriorly translated pelvis  Cervical: decreased lordosis, forward head    Scoliosis: R (+) posterior rib hump  Thoracic curve: RIGHT. Lumbar curve: LEFT. Concave: LEFT   Thoracic:  Hypomobile. Flexed  Pelvis: Rotated RIGHT  RIGHT Posterior Rotation   RIGHT flare: OUT  Innominate:  RIGHT ASIS higher than LEFT   Sacrum: Extended (+) sacral sitting  Knee: Tibial Torsion: RIGHT internal  Hip: RIGHT: Femur in IR    Foot: Supinated RIGHT   Muscle Imbalance/Weakness: RIGHT > LEFT: Hip flexors, Glut med, max, pelvic rotators, quad, hams, paraspinals  Soft Tissue Tightness: LEFT Hip flexors, hams, gastroc soleus, paraspinals    Neuro Screen: denies numbness, tingling, and radiating pain  Function:   Gait:  no gross deviations.   Stair management:    WNL as per patient report.  Sit to stand: Shifts weight to LEFT, Leans to LEFT   Bed Mobility: Guarded    Special Tests:  SLR's Test:  Negative  Heel Walking (L4-L5): Negative  Toe Walking (S1-S2): Negative   Prone Instability Test: Positive  Passive Lumbar Extension Test: Negative  Treatment provided today consisted of:   Therapeutic exercise (97110) x 40 min to address ROM/strength deficits and to develop an initial HEP as noted below.  Current Exercises Past Exercises (not performed today)   Child's pose lengthen R, SB L rotation R  Cat and camel from sacrum   On elbows: hip extension  S/L: shoulder L flexion -> leg lengthening  S/L: clams -> hip abduction -> hip extension   S/L: thoracic rotations with elbows straight and bent   Quadruped: rotations to thoracic - fist on L   Leg hangs hold x 2-5 sec   Grade 1 mobs to lumbar spine   Reviewed and issued written HEP. Demonstrates independence in performance        Patient Education on the condition/pathology, involved anatomy, and exercise rationale.  MOVEMENT/HOME/ADL  MODIFICATION:  stretch R side during [employer] Pilates    ASSESSMENT   Patient with improved exercise tolerance and over all mobility.     PLAN    Review and update written HEP next visit. Manual therapy as appropriate. Progress to site specific strengthening/stabilization exercises as tolerated.   Effective Dates/Duration: 06/25/2023 TO 09/23/2023 (90 days).    Frequency:  1-2 per week      GOALS   Short term goals to be met by 08/06/2023  (6 weeks):  1. Pt will have a MMT improvement in strength to lumbar paraspinals and B LE by 1/2 grade to allow for improved ADL performance.   2. Pt will report a 30% - 50% improvement in back pain with self care activities, ADL's   3. Pt will report/demonstrate an improvement of standing tolerance to 15 minutes or greater to allow for ADL's such as cooking, washing, personal care.  4. Pt will tolerate improved walking  tolerance by 15 minutes or greater to allow for grocery shopping and community independence.  5. Pt will have an improved ability to bend over and pick up a light object, don shoes/socks on and off      Long term goals to be met by 09/23/2023 (90 days):  1. Pt will report 50%-80% or greater improvement in back pain with self care activities, ADL's.  2. Pt will report/demonstrate an improvement of back pain with side lying to allow for restful sleep.   3. Pt will tolerate improved walking tolerance by 30 minutes or greater to allow for grocery shopping and community independence.  4.Pt will have an improved ability to bend over and pick up a light weight/objects with min to manageable back and or PLOF.   5. Pt will report improvement in sleeping tolerance as evidenced by reduced wake up times especially with bed mobility, rolling over in bed with manageable back/hip pain and or PLOF.   6. Pt will report independence with min to manageable back pain with basic home management to allow for improved laundry management and ADL's.  7. Pt will be independent with updated and modified HEP.  8. Pt will be independent with pain management strategies.      [facility]

## 2023-07-22 ENCOUNTER — Encounter
Admit: 2023-07-22 | Discharge: 2023-07-22 | Payer: BLUE CROSS/BLUE SHIELD | Attending: Rehabilitative and Restorative Service Providers" | Primary: Internal Medicine

## 2023-07-22 DIAGNOSIS — M5441 Lumbago with sciatica, right side: Secondary | ICD-10-CM

## 2023-07-22 NOTE — Progress Notes (Signed)
 [name]  [address]  [facility] [address]  Dept: [phone]      Physical Therapy Daily Note     Referring MD: Cornelius Dill *  Diagnosis:     ICD-10-CM    1. Chronic right-sided low back pain with right-sided sciatica  M54.41     G89.29       2. Decreased activity tolerance  R68.89       3. Muscle weakness (generalized)  M62.81         Spine Surgery Date (if applicable):   Prior Spine Surgery: None    Prior Orthopedic Surgeries: ankle fracture  Therapy precautions:None  Co-morbidities affecting plan of care: None    Payor: Pay[place] [employer] /  /  /     Billing pattern: Commercial- substantial/midpoint time each CPT  Total Timed Procedure Codes: 40 min   Total Time: 40 min    Modifier needed: No  Episode visit count:  5     PERTINENT MEDICAL HISTORY   Medications: reviewed in chart  Past medical and surgical history:   Past Medical History:   Diagnosis Date    Arthritis     Dyslipidemia     MRSA infection     x2 on leg last episode 4 years ago      Past Surgical History:   Procedure Laterality Date    ANESTH,SURGERY OF SHOULDER Right     arthroscopy    ORTHOPEDIC SURGERY      left ankle    PR UNLISTED PROCEDURE ABDOMEN PERITONEUM & OMENTUM      chole     Allergies:   Allergies   Allergen Reactions    Pravastatin Myalgia     Other reaction(s): Myalgia, Myalgia-Intolerance    Methylprednisolone Hives     Patient states this only happened once with a taper.  Taken prior stable doses for several days with no reaction. No prior reactions with steroid injections in the past.    Other reaction(s): Hives/Swelling-Allergy   Patient states this only happened once with a taper.  Taken prior stable doses for several days with no reaction. No prior reactions with steroid injections in the past.   Patient states this only happened once with a taper.  Taken prior stable doses for several days with no reaction. No prior reactions with steroid injections in the past.    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 Symptoms include right buttock pain radiating to the front lower leg. These symptoms seem to follow a R sided L3-4 and L4-5 distribution, affecting primarily the L4 and L5 nerve roots respectively.     History of Present Illness: How did symptoms start: Unknown cause. She did report having back pain for years and has gotten worse in the last year or so.  Reports pain radiates to the R leg. Does not go below the knee. Radiating pain with prolonged sitting, Pain is bilateral lumbar paraspinals, worse on R.   Described current symptoms as: numb, ache, dull, spasms     SUBJECTIVE   Reports she is doing well. Back is better.     Patient Stated Goals: ideas of exercises to help me manage my pain daily and help to stop progression  Home program compliance: Completing as prescribed    Received previous outpatient therapy? Yes;  in the last February. On and off for a while  Chief complaints/history of injury:  Date symptoms began: 2024  Lonne Roan of condition: Chronic (continuous duration > 3 months)  Primary cause of current episode: Unspecified    Pain Assessment:  Pain location: R worse than L, Bilateral Low Back. Radiating to R posterior leg  Average Pain/symptom intensity (0-10 scale)  Last 24 hours: 5/10  Last week (1-7 days): 5/10  Description: aching, dull, and numb  Aggravating factors: rolling over in bed, prolonged standing, and walking  Alleviating factors: sitting, lying down, and ice  How often do you feel symptoms? Frequently (51-75%)  How much have your symptoms interfered with daily activities? Quite a bit  Sleep: minimally disturbed (less than 6 hours sleep) and wakes 4-5 times/night    Prior Level Of Function: Independent and active without physical limitations and participated in self directed walking program, play tennis  Current  Level Of Function:   The Patient-Specific Functional Scale:         (0 - unable             10 - able  to perform activity)  Activity: 06/25/23     1. Difficulty/pain with bending over - pick up a light object off floor  - flexion to extension 1/10     2. Difficulty/pain with walking tolerance >15 minutes 1/10     3. Difficulty/pain with bed mobility, supine <-> sit, rolling over in bed, finding a comfortable position  1/10     4. Difficulty/pain with side lying   1/10     Average score:  4/4      1     Interpretation of Score: The Patient-Specific functional scale is used to quantify activity limitation and measure functional outcome for patients with any orthopaedic condition. Each activity is scored 0-10, 0 representing unable to perform activity and 10 able to perform activity at the same level as before injury or problem. Minimum detectable change is 2 points for average score and 3 points for single activity score.       OBJECTIVE EXAMINATION     Pain relieving position: flexion, SB L rotation R   Functional Outcome Questionnaire:   Modified Oswestry Low Back Pain Disability Questionnaire: 13/50= 26% functional deficit     Observation/Palpation:    Posture: Increased thoracic kyphosis, Decreased lumbar lordosis, and anteriorly translated pelvis  Cervical: decreased lordosis, forward head    Scoliosis: R (+) posterior rib hump  Thoracic curve: RIGHT. Lumbar curve: LEFT. Concave: LEFT   Thoracic:  Hypomobile. Flexed  Pelvis: Rotated RIGHT  RIGHT Posterior Rotation   RIGHT flare: OUT  Innominate:  RIGHT ASIS higher than LEFT   Sacrum: Extended (+) sacral sitting  Knee: Tibial Torsion: RIGHT internal  Hip: RIGHT: Femur in IR    Foot: Supinated RIGHT   Muscle Imbalance/Weakness: RIGHT > LEFT: Hip flexors, Glut med, max, pelvic rotators, quad, hams, paraspinals  Soft Tissue Tightness: LEFT Hip flexors, hams, gastroc soleus, paraspinals    Neuro Screen: denies numbness, tingling, and radiating pain  Function:   Gait:  no gross deviations.   Stair management:    WNL as per patient report.  Sit to stand: Shifts weight to LEFT, Leans to LEFT   Bed Mobility: Guarded    Special Tests:  SLR's Test:  Negative  Heel Walking (L4-L5): Negative  Toe Walking (S1-S2): Negative   Prone Instability Test: Positive  Passive Lumbar Extension Test: Negative    Treatment provided today  consisted of:   Therapeutic exercise (97110) x 40 min to address ROM/strength deficits and to develop a HEP as noted below.  Current Exercises Past Exercises (not performed today)   Child's pose lengthen R, SB L rotation R    FOAM ROLL  I  Y and W  Elbows press and thoracic lifts   Snow angels  T with red band   Diagonals with red T band   Heel press with mini bridge  Scap protractions and retractions    Standing: lengthen L, SB R, lean   Counter planks  Bent over leg kicks   Reviewed and issued written HEP. Demonstrates independence in performance Cat and camel from sacrum   On elbows: hip extension  S/L: shoulder L flexion -> leg lengthening  S/L: clams -> hip abduction -> hip extension   S/L: thoracic rotations with elbows straight and bent   Quadruped: rotations to thoracic - fist on L   Leg hangs hold x 2-5 sec   Grade 1 mobs to lumbar spine        Patient Education on the condition/pathology, involved anatomy, and exercise rationale.  MOVEMENT/HOME/ADL  MODIFICATION:  stretch R side during Reformer Pilates    ASSESSMENT   Patient with improved pain and exercise tolerance.      PLAN    Review and update written HEP next visit. Manual therapy as appropriate. Progress to site specific strengthening/stabilization exercises as tolerated.   Effective Dates/Duration: 06/25/2023 TO 09/23/2023 (90 days).    Frequency:  1-2 per week      GOALS   Short term goals to be met by 08/06/2023  (6 weeks):  1. Pt will have a MMT improvement in strength to lumbar paraspinals and B LE by 1/2 grade to allow for improved ADL performance.   2. Pt will report a 30% - 50% improvement in  back pain with self care activities, ADL's   3. Pt will report/demonstrate an improvement of standing tolerance to 15 minutes or greater to allow for ADL's such as cooking, washing, personal care.  4. Pt will tolerate improved walking tolerance by 15 minutes or greater to allow for grocery shopping and community independence.  5. Pt will have an improved ability to bend over and pick up a light object, don shoes/socks on and off      Long term goals to be met by 09/23/2023 (90 days):  1. Pt will report 50%-80% or greater improvement in back pain with self care activities, ADL's.  2. Pt will report/demonstrate an improvement of back pain with side lying to allow for restful sleep.   3. Pt will tolerate improved walking tolerance by 30 minutes or greater to allow for grocery shopping and community independence.  4.Pt will have an improved ability to bend over and pick up a light weight/objects with min to manageable back and or PLOF.   5. Pt will report improvement in sleeping tolerance as evidenced by reduced wake up times especially with bed mobility, rolling over in bed with manageable back/hip pain and or PLOF.   6. Pt will report independence with min to manageable back pain with basic home management to allow for improved laundry management and ADL's.  7. Pt will be independent with updated and modified HEP.  8. Pt will be independent with pain management strategies.      [facility]

## 2023-07-26 ENCOUNTER — Encounter
Admit: 2023-07-26 | Discharge: 2023-07-26 | Payer: BLUE CROSS/BLUE SHIELD | Attending: Rehabilitative and Restorative Service Providers" | Primary: Internal Medicine

## 2023-07-26 DIAGNOSIS — G8929 Other chronic pain: Secondary | ICD-10-CM

## 2023-07-26 NOTE — Progress Notes (Signed)
 [name]  [address]  [place] [facility] [address]  Dept: [phone]      Physical Therapy Daily Note     Referring MD: Cornelius Dill *  Diagnosis:     ICD-10-CM    1. Chronic right-sided low back pain with right-sided sciatica  M54.41     G89.29       2. Decreased activity tolerance  R68.89       3. Muscle weakness (generalized)  M62.81           Spine Surgery Date (if applicable):   Prior Spine Surgery: None    Prior Orthopedic Surgeries: ankle fracture  Therapy precautions:None  Co-morbidities affecting plan of care: None    Payor: Pay[place] [employer] /  /  /     Billing pattern: Commercial- substantial/midpoint time each CPT  Total Timed Procedure Codes: 40 min   Total Time: 40 min    Modifier needed: No  Episode visit count:  6     PERTINENT MEDICAL HISTORY   Medications: reviewed in chart  Past medical and surgical history:   Past Medical History:   Diagnosis Date    Arthritis     Dyslipidemia     MRSA infection     x2 on leg last episode 4 years ago      Past Surgical History:   Procedure Laterality Date    ANESTH,SURGERY OF SHOULDER Right     arthroscopy    ORTHOPEDIC SURGERY      left ankle    PR UNLISTED PROCEDURE ABDOMEN PERITONEUM & OMENTUM      chole     Allergies:   Allergies   Allergen Reactions    Pravastatin Myalgia     Other reaction(s): Myalgia, Myalgia-Intolerance    Methylprednisolone Hives     Patient states this only happened once with a taper.  Taken prior stable doses for several days with no reaction. No prior reactions with steroid injections in the past.    Other reaction(s): Hives/Swelling-Allergy   Patient states this only happened once with a taper.  Taken prior stable doses for several days with no reaction. No prior reactions with steroid injections in the past.   Patient states this only happened once with a taper.  Taken prior stable doses for several days with no reaction. No prior reactions with steroid injections in the past.    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 Symptoms include right buttock pain radiating to the front lower leg. These symptoms seem to follow a R sided L3-4 and L4-5 distribution, affecting primarily the L4 and L5 nerve roots respectively.     History of Present Illness: How did symptoms start: Unknown cause. She did report having back pain for years and has gotten worse in the last year or so.  Reports pain radiates to the R leg. Does not go below the knee. Radiating pain with prolonged sitting, Pain is bilateral lumbar paraspinals, worse on R.   Described current symptoms as: numb, ache, dull, spasms     SUBJECTIVE   Reports she is doing well. Back is better.     Patient Stated Goals: ideas of exercises to help me manage my pain daily and help to stop progression  Home program compliance: Completing as prescribed    Received previous outpatient  therapy? Yes; in the last February. On and off for a while  Chief complaints/history of injury:  Date symptoms began: 2024  Lonne Roan of condition: Chronic (continuous duration > 3 months)  Primary cause of current episode: Unspecified    Pain Assessment:  Pain location: R worse than L, Bilateral Low Back. Radiating to R posterior leg  Average Pain/symptom intensity (0-10 scale)  Last 24 hours: 5/10  Last week (1-7 days): 5/10  Description: aching, dull, and numb  Aggravating factors: rolling over in bed, prolonged standing, and walking  Alleviating factors: sitting, lying down, and ice  How often do you feel symptoms? Frequently (51-75%)  How much have your symptoms interfered with daily activities? Quite a bit  Sleep: minimally disturbed (less than 6 hours sleep) and wakes 4-5 times/night    Prior Level Of Function: Independent and active without physical limitations and participated in self directed walking program, play tennis  Current  Level Of Function:   The Patient-Specific Functional Scale:         (0 - unable             10 - able  to perform activity)  Activity: 06/25/23     1. Difficulty/pain with bending over - pick up a light object off floor  - flexion to extension 1/10     2. Difficulty/pain with walking tolerance >15 minutes 1/10     3. Difficulty/pain with bed mobility, supine <-> sit, rolling over in bed, finding a comfortable position  1/10     4. Difficulty/pain with side lying   1/10     Average score:  4/4      1     Interpretation of Score: The Patient-Specific functional scale is used to quantify activity limitation and measure functional outcome for patients with any orthopaedic condition. Each activity is scored 0-10, 0 representing unable to perform activity and 10 able to perform activity at the same level as before injury or problem. Minimum detectable change is 2 points for average score and 3 points for single activity score.       OBJECTIVE EXAMINATION     Pain relieving position: flexion, SB L rotation R   Functional Outcome Questionnaire:   Modified Oswestry Low Back Pain Disability Questionnaire: 13/50= 26% functional deficit     Observation/Palpation:    Posture: Increased thoracic kyphosis, Decreased lumbar lordosis, and anteriorly translated pelvis  Cervical: decreased lordosis, forward head    Scoliosis: R (+) posterior rib hump  Thoracic curve: RIGHT. Lumbar curve: LEFT. Concave: LEFT   Thoracic:  Hypomobile. Flexed  Pelvis: Rotated RIGHT  RIGHT Posterior Rotation   RIGHT flare: OUT  Innominate:  RIGHT ASIS higher than LEFT   Sacrum: Extended (+) sacral sitting  Knee: Tibial Torsion: RIGHT internal  Hip: RIGHT: Femur in IR    Foot: Supinated RIGHT   Muscle Imbalance/Weakness: RIGHT > LEFT: Hip flexors, Glut med, max, pelvic rotators, quad, hams, paraspinals  Soft Tissue Tightness: LEFT Hip flexors, hams, gastroc soleus, paraspinals    Neuro Screen: denies numbness, tingling, and radiating pain  Function:   Gait:  no gross deviations.   Stair management:    WNL as per patient report.  Sit to stand: Shifts weight to LEFT, Leans to LEFT   Bed Mobility: Guarded    Special Tests:  SLR's Test:  Negative  Heel Walking (L4-L5): Negative  Toe Walking (S1-S2): Negative   Prone Instability Test: Positive  Passive Lumbar Extension Test: Negative    Treatment  provided today consisted of:   Therapeutic exercise (97110) x 40 min to address ROM/strength deficits and to develop a HEP as noted below.  Current Exercises Past Exercises (not performed today)   Child's pose lengthen R, SB L rotation R    STANDING  I  Y and W  Elbows press and thoracic lifts   Snow angels  T with red band   Diagonals with red T band   Scap protractions and retractions    Standing: lengthen L, SB R, rotation L  Counter planks on ball  As above: leg kicks   On elbows: leg kicks  Reviewed and issued written HEP. Demonstrates independence in performance Cat and camel from sacrum   On elbows: hip extension  S/L: shoulder L flexion -> leg lengthening  S/L: clams -> hip abduction -> hip extension   S/L: thoracic rotations with elbows straight and bent   Quadruped: rotations to thoracic - fist on L   Leg hangs hold x 2-5 sec   Grade 1 mobs to lumbar spine   Heel press with mini bridge       Patient Education on the condition/pathology, involved anatomy, and exercise rationale.  MOVEMENT/HOME/ADL  MODIFICATION:  stretch R side during Reformer Pilates    ASSESSMENT   Patient with improved pain and exercise tolerance.      PLAN    Review and update written HEP next visit. Manual therapy as appropriate. Progress to site specific strengthening/stabilization exercises as tolerated.   Effective Dates/Duration: 06/25/2023 TO 09/23/2023 (90 days).    Frequency:  1-2 per week      GOALS   Short term goals to be met by 08/06/2023  (6 weeks):  1. Pt will have a MMT improvement in strength to lumbar paraspinals and B LE by 1/2 grade to allow for improved ADL performance.   2. Pt will  report a 30% - 50% improvement in back pain with self care activities, ADL's   3. Pt will report/demonstrate an improvement of standing tolerance to 15 minutes or greater to allow for ADL's such as cooking, washing, personal care.  4. Pt will tolerate improved walking tolerance by 15 minutes or greater to allow for grocery shopping and community independence.  5. Pt will have an improved ability to bend over and pick up a light object, don shoes/socks on and off      Long term goals to be met by 09/23/2023 (90 days):  1. Pt will report 50%-80% or greater improvement in back pain with self care activities, ADL's.  2. Pt will report/demonstrate an improvement of back pain with side lying to allow for restful sleep.   3. Pt will tolerate improved walking tolerance by 30 minutes or greater to allow for grocery shopping and community independence.  4.Pt will have an improved ability to bend over and pick up a light weight/objects with min to manageable back and or PLOF.   5. Pt will report improvement in sleeping tolerance as evidenced by reduced wake up times especially with bed mobility, rolling over in bed with manageable back/hip pain and or PLOF.   6. Pt will report independence with min to manageable back pain with basic home management to allow for improved laundry management and ADL's.  7. Pt will be independent with updated and modified HEP.  8. Pt will be independent with pain management strategies.      [facility]

## 2023-07-29 ENCOUNTER — Encounter
Payer: BLUE CROSS/BLUE SHIELD | Attending: Rehabilitative and Restorative Service Providers" | Primary: Internal Medicine

## 2023-07-30 ENCOUNTER — Ambulatory Visit: Admit: 2023-07-30 | Discharge: 2023-07-30 | Payer: BLUE CROSS/BLUE SHIELD | Primary: Internal Medicine

## 2023-07-30 DIAGNOSIS — M48061 Spinal stenosis, lumbar region without neurogenic claudication: Secondary | ICD-10-CM

## 2023-08-01 ENCOUNTER — Encounter
Admit: 2023-08-01 | Discharge: 2023-08-01 | Payer: BLUE CROSS/BLUE SHIELD | Attending: Rehabilitative and Restorative Service Providers" | Primary: Internal Medicine

## 2023-08-01 DIAGNOSIS — M6281 Muscle weakness (generalized): Secondary | ICD-10-CM

## 2023-08-01 NOTE — Progress Notes (Signed)
 Name: Monique Barker  Date of Birth: [birth year]  Gender: female  MRN: [id number]  Age: 68 y.o.    Chief Complaint: Back pain and right lower extremity pain    Assessment/Plan at Previous Visit: Right sided ESI    History of Present Illness  The patient is a 68 year old female who presents today for follow-up. She was last seen on 06/18/2023 for right lower extremity pain and radicular symptoms. A right-sided L3-L4 and L4-L5 transforaminal epidural steroid injection (ESI) was ordered and subsequently performed by Dr. Jerrel Mor on 07/01/2023.    She reports that the injection provided minimal relief although there was a period of good relief lasting about 2 weeks. Initially, there was a period of improved mobility, during which she engaged in walking exercises. However, her condition deteriorated unexpectedly, leading to sleep disturbances due to discomfort when lying on her right side. The pain radiates down the front of her legs, necessitating frequent position changes during sleep.  Right greater than left.  She expresses frustration with her inability to participate in physical activities such as walking for exercise and weight training, which she believes exacerbate her symptoms. She is open to surgical intervention if it can provide significant relief.    SOCIAL HISTORY  Ex[place] another [place]. Mentioned walking with Haskell Linker.               Medications:       Current Outpatient Medications:     Coenzyme Q10 100 MG TABS, Take by mouth, Disp: , Rfl:     Estradiol (VAGIFEM) 10 MCG TABS vaginal tablet, Insert one tablet vaginally twice weekly at night., Disp: , Rfl:     rosuvastatin (CRESTOR) 5 MG tablet, Take 5 mg by mouth, Disp: , Rfl:     CALCIUM PO, Take by mouth, Disp: , Rfl:     CRANBERRY PO, Take by mouth, Disp: , Rfl:     TURMERIC PO, Take by mouth (Patient not taking: Reported on 10/25/2020), Disp: , Rfl:     ZINC PO, Take by mouth, Disp: , Rfl:      ascorbic acid (VITAMIN C) 500 MG tablet, Take by mouth daily (Patient not taking: Reported on 10/25/2020), Disp: , Rfl:     diclofenac (VOLTAREN) 75 MG EC tablet, Take by mouth, Disp: , Rfl:     fexofenadine (ALLEGRA) 180 MG tablet, Take by mouth, Disp: , Rfl:     hydroCHLOROthiazide (HYDRODIURIL) 12.5 MG tablet, Take 12.5 mg by mouth daily (Patient not taking: Reported on 10/25/2020), Disp: , Rfl:     montelukast (SINGULAIR) 10 MG tablet, Take 10 mg by mouth, Disp: , Rfl:     omeprazole (PRILOSEC) 20 MG delayed release capsule, Take 20 mg by mouth daily, Disp: , Rfl:     Allergies:  Allergies   Allergen Reactions    Pravastatin Myalgia     Other reaction(s): Myalgia, Myalgia-Intolerance    Methylprednisolone Hives     Patient states this only happened once with a taper.  Taken prior stable doses for several days with no reaction. No prior reactions with steroid injections in the past.    Other reaction(s): Hives/Swelling-Allergy   Patient states this only happened once with a taper.  Taken prior stable doses for several days with no reaction. No prior reactions with steroid injections in the past.   Patient states this only happened once with a taper.  Taken prior stable doses for several days with no reaction. No  prior reactions with steroid injections in the past.    Evolocumab      Other Reaction(s): Hives/Swelling-Allergy         Physical Exam:     The patient is well-developed and well nourished. Mood and cognition appear normal. Patient appears oriented to person/place/time. , No evidence of increased work of breathing on exam. , and Motor grossly intact in the BLE, no observable deficit. Ambulates without difficulty.         Results    No new imaging today    Diagnosis:      ICD-10-CM    1. Spinal stenosis of lumbar region, unspecified whether neurogenic claudication present  M48.061 Ambulatory Referral to Spine Injection      2. Lumbar radiculopathy  M54.16 Ambulatory Referral to Spine Injection           Assessment & Plan  1. Right lower extremity pain and radicular symptoms:    Repeat right L3-4 and right L4-5 transforaminal ESI to obtain more objective relief before further considering any surgical options. The injection will be scheduled, and follow-up will be conducted afterward to assess the effectiveness of the injection.  Continue therapy as much is as tolerated.  Continue taking Voltaren for anti-inflammatory relief.    Follow-up  Follow up after the injection.            - Injection: The patient will be referred for a lumbar steroid injection to help reduce the symptoms. The patient understands the risks including hyperglycemia, immunosuppression, meningitis, cerebrospinal fluid leak, epidural hematoma, and reaction to medication. The patient may benefit from additional injections depending on the response. The injection should be a right L3-4 and right L4-5 transforaminal epidural steroid injections.            Electronically Signed by Jaymes Mew, MD  08/01/23  9:52 AM

## 2023-08-01 NOTE — Progress Notes (Signed)
 [name]  [address]  [place] [facility] [address]  Dept: [phone]      Physical Therapy Daily Note     Referring MD: Cornelius Dill *  Diagnosis:     ICD-10-CM    1. Muscle weakness (generalized)  M62.81       2. Decreased activity tolerance  R68.89       3. Chronic right-sided low back pain with right-sided sciatica  M54.41     G89.29             Spine Surgery Date (if applicable):   Prior Spine Surgery: None    Prior Orthopedic Surgeries: ankle fracture  Therapy precautions:None  Co-morbidities affecting plan of care: None    Payor: [place] [employer] /  /  /     Billing pattern: Commercial- substantial/midpoint time each CPT  Total Timed Procedure Codes: 40 min   Total Time: 40 min    Modifier needed: No  Episode visit count:  7     PERTINENT MEDICAL HISTORY   Medications: reviewed in chart  Past medical and surgical history:   Past Medical History:   Diagnosis Date    Arthritis     Dyslipidemia     MRSA infection     x2 on leg last episode 4 years ago      Past Surgical History:   Procedure Laterality Date    ANESTH,SURGERY OF SHOULDER Right     arthroscopy    ORTHOPEDIC SURGERY      left ankle    PR UNLISTED PROCEDURE ABDOMEN PERITONEUM & OMENTUM      chole     Allergies:   Allergies   Allergen Reactions    Pravastatin Myalgia     Other reaction(s): Myalgia, Myalgia-Intolerance    Methylprednisolone Hives     Patient states this only happened once with a taper.  Taken prior stable doses for several days with no reaction. No prior reactions with steroid injections in the past.    Other reaction(s): Hives/Swelling-Allergy   Patient states this only happened once with a taper.  Taken prior stable doses for several days with no reaction. No prior reactions with steroid injections in the past.   Patient states this only happened once with a taper.  Taken prior stable doses for several days with no reaction. No prior reactions with steroid injections in the past.    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 Symptoms include right buttock pain radiating to the front lower leg. These symptoms seem to follow a R sided L3-4 and L4-5 distribution, affecting primarily the L4 and L5 nerve roots respectively.     History of Present Illness: How did symptoms start: Unknown cause. She did report having back pain for years and has gotten worse in the last year or so.  Reports pain radiates to the R leg. Does not go below the knee. Radiating pain with prolonged sitting, Pain is bilateral lumbar paraspinals, worse on R.   Described current symptoms as: numb, ache, dull, spasms     SUBJECTIVE   Reports back pain the past 2 weeks and is gradually progressing.     Patient Stated Goals: ideas of exercises to help me manage my pain daily and help to stop progression  Home program compliance: Completing as prescribed  Received previous outpatient therapy? Yes; in the last February. On and off for a while  Chief complaints/history of injury:  Date symptoms began: 2024  Lonne Roan of condition: Chronic (continuous duration > 3 months)  Primary cause of current episode: Unspecified    Pain Assessment:  Pain location: R worse than L, Bilateral Low Back. Radiating to R posterior leg  Average Pain/symptom intensity (0-10 scale)  Last 24 hours: 5/10  Last week (1-7 days): 5/10  Description: aching, dull, and numb  Aggravating factors: rolling over in bed, prolonged standing, and walking  Alleviating factors: sitting, lying down, and ice  How often do you feel symptoms? Frequently (51-75%)  How much have your symptoms interfered with daily activities? Quite a bit  Sleep: minimally disturbed (less than 6 hours sleep) and wakes 4-5 times/night    Prior Level Of Function: Independent and active without physical limitations and participated in self directed walking program,  play tennis  Current Level Of Function:   The Patient-Specific Functional Scale:         (0 - unable             10 - able  to perform activity)  Activity: 06/25/23     1. Difficulty/pain with bending over - pick up a light object off floor  - flexion to extension 1/10     2. Difficulty/pain with walking tolerance >15 minutes 1/10     3. Difficulty/pain with bed mobility, supine <-> sit, rolling over in bed, finding a comfortable position  1/10     4. Difficulty/pain with side lying   1/10     Average score:  4/4      1     Interpretation of Score: The Patient-Specific functional scale is used to quantify activity limitation and measure functional outcome for patients with any orthopaedic condition. Each activity is scored 0-10, 0 representing unable to perform activity and 10 able to perform activity at the same level as before injury or problem. Minimum detectable change is 2 points for average score and 3 points for single activity score.       OBJECTIVE EXAMINATION     Pain relieving position: flexion, SB L rotation R   Functional Outcome Questionnaire:   Modified Oswestry Low Back Pain Disability Questionnaire: 13/50= 26% functional deficit     Observation/Palpation:    Posture: Increased thoracic kyphosis, Decreased lumbar lordosis, and anteriorly translated pelvis  Cervical: decreased lordosis, forward head    Scoliosis: R (+) posterior rib hump  Thoracic curve: RIGHT. Lumbar curve: LEFT. Concave: LEFT   Thoracic:  Hypomobile. Flexed  Pelvis: Rotated RIGHT  RIGHT Posterior Rotation   RIGHT flare: OUT  Innominate:  RIGHT ASIS higher than LEFT   Sacrum: Extended (+) sacral sitting  Knee: Tibial Torsion: RIGHT internal  Hip: RIGHT: Femur in IR    Foot: Supinated RIGHT   Muscle Imbalance/Weakness: RIGHT > LEFT: Hip flexors, Glut med, max, pelvic rotators, quad, hams, paraspinals  Soft Tissue Tightness: LEFT Hip flexors, hams, gastroc soleus, paraspinals    Neuro Screen: denies numbness, tingling, and radiating  pain  Function:   Gait: no gross deviations.   Stair management:    WNL as per patient report.  Sit to stand: Shifts weight to LEFT, Leans to LEFT   Bed Mobility: Guarded    Special Tests:  SLR's Test:  Negative  Heel Walking (L4-L5): Negative  Toe Walking (S1-S2): Negative   Prone Instability Test: Positive  Passive Lumbar Extension Test: Negative  Treatment provided today consisted of:   Therapeutic exercise (97110) x 40 min to address ROM/strength deficits and to develop a HEP as noted below.  Current Exercises Past Exercises (not performed today)   Child's pose   Child's pose -> lengthen L, SB R rotation L  SKTC -> hams stretches -> ankle pumps  Wide feet rotations  Cat and camel min movement  UP4-PL5 on R  Pelvic rotations on R  Reviewed and issued written HEP. Demonstrates independence in performance Cat and camel from sacrum   On elbows: hip extension  S/L: shoulder L flexion -> leg lengthening  S/L: clams -> hip abduction -> hip extension   S/L: thoracic rotations with elbows straight and bent   Quadruped: rotations to thoracic - fist on L   Leg hangs hold x 2-5 sec   Grade 1 mobs to lumbar spine   Heel press with mini bridge    STANDING  I  Y and W  Elbows press and thoracic lifts   Snow angels  T with red band   Diagonals with red T band   Scap protractions and retractions    Standing: lengthen L, SB R, rotation L  Counter planks on ball  As above: leg kicks   On elbows: leg kicks       Patient Education on the condition/pathology, involved anatomy, and exercise rationale.  MOVEMENT/HOME/ADL  MODIFICATION:  stretch R side during Reformer Pilates    ASSESSMENT   Patient;s exercises is changed to flexion biased today along with manual therapy to L spine. She states it's better and hoped that it will help long term.     PLAN    Review and update written HEP next visit. Manual therapy as appropriate. Progress to site specific strengthening/stabilization exercises as tolerated. Plan of care changed to  flexion. Will reassess in June when patient returns from vacation.    Effective Dates/Duration: 06/25/2023 TO 09/23/2023 (90 days).    Frequency:  1-2 per week      GOALS   Short term goals to be met by 08/06/2023  (6 weeks):  1. Pt will have a MMT improvement in strength to lumbar paraspinals and B LE by 1/2 grade to allow for improved ADL performance.   2. Pt will report a 30% - 50% improvement in back pain with self care activities, ADL's   3. Pt will report/demonstrate an improvement of standing tolerance to 15 minutes or greater to allow for ADL's such as cooking, washing, personal care.  4. Pt will tolerate improved walking tolerance by 15 minutes or greater to allow for grocery shopping and community independence.  5. Pt will have an improved ability to bend over and pick up a light object, don shoes/socks on and off      Long term goals to be met by 09/23/2023 (90 days):  1. Pt will report 50%-80% or greater improvement in back pain with self care activities, ADL's.  2. Pt will report/demonstrate an improvement of back pain with side lying to allow for restful sleep.   3. Pt will tolerate improved walking tolerance by 30 minutes or greater to allow for grocery shopping and community independence.  4.Pt will have an improved ability to bend over and pick up a light weight/objects with min to manageable back and or PLOF.   5. Pt will report improvement in sleeping tolerance as evidenced by reduced wake up times especially with bed mobility, rolling over in bed with manageable back/hip pain and or  PLOF.   6. Pt will report independence with min to manageable back pain with basic home management to allow for improved laundry management and ADL's.  7. Pt will be independent with updated and modified HEP.  8. Pt will be independent with pain management strategies.      [facility]

## 2023-08-14 ENCOUNTER — Encounter
Admit: 2023-08-14 | Discharge: 2023-08-14 | Payer: BLUE CROSS/BLUE SHIELD | Attending: Rehabilitative and Restorative Service Providers" | Primary: Internal Medicine

## 2023-08-14 DIAGNOSIS — M6281 Muscle weakness (generalized): Secondary | ICD-10-CM

## 2023-08-14 NOTE — Progress Notes (Signed)
 [facility]  [address]  [place] [address]  Dept: [phone]      Physical Therapy Daily Note     Referring MD: Cornelius Dill *  Diagnosis:     ICD-10-CM    1. Muscle weakness (generalized)  M62.81       2. Decreased activity tolerance  R68.89       3. Chronic right-sided low back pain with right-sided sciatica  M54.41     G89.29         Spine Surgery Date (if applicable):   Prior Spine Surgery: None    Prior Orthopedic Surgeries: ankle fracture  Therapy precautions:None  Co-morbidities affecting plan of care: None    Payor: Pay[place] [employer] /  /  /     Billing pattern: Commercial- substantial/midpoint time each CPT  Total Timed Procedure Codes: 40 min   Total Time: 40 min    Modifier needed: No  Episode visit count:  8     PERTINENT MEDICAL HISTORY   Medications: reviewed in chart  Past medical and surgical history:   Past Medical History:   Diagnosis Date    Arthritis     Dyslipidemia     MRSA infection     x2 on leg last episode 4 years ago      Past Surgical History:   Procedure Laterality Date    ANESTH,SURGERY OF SHOULDER Right     arthroscopy    ORTHOPEDIC SURGERY      left ankle    PR UNLISTED PROCEDURE ABDOMEN PERITONEUM & OMENTUM      chole     Allergies:   Allergies   Allergen Reactions    Pravastatin Myalgia     Other reaction(s): Myalgia, Myalgia-Intolerance    Methylprednisolone Hives     Patient states this only happened once with a taper.  Taken prior stable doses for several days with no reaction. No prior reactions with steroid injections in the past.    Other reaction(s): Hives/Swelling-Allergy   Patient states this only happened once with a taper.  Taken prior stable doses for several days with no reaction. No prior reactions with steroid injections in the past.   Patient states this only happened once with a taper.  Taken prior stable doses for several days with no reaction. No prior reactions with steroid injections in the past.    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 Symptoms include right buttock pain radiating to the front lower leg. These symptoms seem to follow a R sided L3-4 and L4-5 distribution, affecting primarily the L4 and L5 nerve roots respectively.     History of Present Illness: How did symptoms start: Unknown cause. She did report having back pain for years and has gotten worse in the last year or so.  Reports pain radiates to the R leg. Does not go below the knee. Radiating pain with prolonged sitting, Pain is bilateral lumbar paraspinals, worse on R.   Described current symptoms as: numb, ache, dull, spasms     SUBJECTIVE   Reports back pain the past 2 weeks and is gradually progressing.     Patient Stated Goals: ideas of exercises to help me manage my pain daily and help to stop progression  Home program compliance: Completing as prescribed    Received previous  outpatient therapy? Yes; in the last February. On and off for a while  Chief complaints/history of injury:  Date symptoms began: 2024  [facility] of condition: Chronic (continuous duration > 3 months)  Primary cause of current episode: Unspecified    Pain Assessment:  Pain location: R worse than L, Bilateral Low Back. Radiating to R posterior leg  Average Pain/symptom intensity (0-10 scale)  Last 24 hours: 5/10  Last week (1-7 days): 5/10  Description: aching, dull, and numb  Aggravating factors: rolling over in bed, prolonged standing, and walking  Alleviating factors: sitting, lying down, and ice  How often do you feel symptoms? Frequently (51-75%)  How much have your symptoms interfered with daily activities? Quite a bit  Sleep: minimally disturbed (less than 6 hours sleep) and wakes 4-5 times/night    Prior Level Of Function: Independent and active without physical limitations and participated in self directed walking program,  play tennis  Current Level Of Function:   The Patient-Specific Functional Scale:         (0 - unable             10 - able  to perform activity)  Activity: 06/25/23     1. Difficulty/pain with bending over - pick up a light object off floor  - flexion to extension 1/10     2. Difficulty/pain with walking tolerance >15 minutes 1/10     3. Difficulty/pain with bed mobility, supine <-> sit, rolling over in bed, finding a comfortable position  1/10     4. Difficulty/pain with side lying   1/10     Average score:  4/4      1     Interpretation of Score: The Patient-Specific functional scale is used to quantify activity limitation and measure functional outcome for patients with any orthopaedic condition. Each activity is scored 0-10, 0 representing unable to perform activity and 10 able to perform activity at the same level as before injury or problem. Minimum detectable change is 2 points for average score and 3 points for single activity score.       OBJECTIVE EXAMINATION     Pain relieving position: flexion, SB L rotation R   Functional Outcome Questionnaire:   Modified Oswestry Low Back Pain Disability Questionnaire: 13/50= 26% functional deficit     Observation/Palpation:    Posture: Increased thoracic kyphosis, Decreased lumbar lordosis, and anteriorly translated pelvis  Cervical: decreased lordosis, forward head    Scoliosis: R (+) posterior rib hump  Thoracic curve: RIGHT. Lumbar curve: LEFT. Concave: LEFT   Thoracic:  Hypomobile. Flexed  Pelvis: Rotated RIGHT  RIGHT Posterior Rotation   RIGHT flare: OUT  Innominate:  RIGHT ASIS higher than LEFT   Sacrum: Extended (+) sacral sitting  Knee: Tibial Torsion: RIGHT internal  Hip: RIGHT: Femur in IR    Foot: Supinated RIGHT   Muscle Imbalance/Weakness: RIGHT > LEFT: Hip flexors, Glut med, max, pelvic rotators, quad, hams, paraspinals  Soft Tissue Tightness: LEFT Hip flexors, hams, gastroc soleus, paraspinals    Neuro Screen: denies numbness, tingling, and radiating  pain  Function:   Gait: no gross deviations.   Stair management:    WNL as per patient report.  Sit to stand: Shifts weight to LEFT, Leans to LEFT   Bed Mobility: Guarded    Special Tests:  SLR's Test:  Negative  Heel Walking (L4-L5): Negative  Toe Walking (S1-S2): Negative   Prone Instability Test: Positive  Passive Lumbar Extension Test: Negative  Treatment provided today consisted of:   Therapeutic exercise (97110) x 40 min to address ROM/strength deficits and to develop a HEP as noted below.  Current Exercises Past Exercises (not performed today)   Nu Step L7 x 10 minutes    LIGHT BAND  Shoulder pull downs  Chops  Over head press     BLUE BAND  Mini squats  Stepping forward  Stepping backward     Reviewed and issued written HEP. Demonstrates independence in performance S/L: shoulder L flexion -> leg lengthening  S/L: clams -> hip abduction -> hip extension   S/L: thoracic rotations with elbows straight and bent   Quadruped: rotations to thoracic - fist on L     STANDING  I  Y and W  Elbows press and thoracic lifts   Snow angels  T with red band   Diagonals with red T band   Scap protractions and retractions    Standing: lengthen L, SB R, rotation L  Counter planks on ball  As above: leg kicks   On elbows: leg kicks       Patient Education on the condition/pathology, involved anatomy, and exercise rationale.  MOVEMENT/HOME/ADL  MODIFICATION:  stretch R side during [employer] Pilates    ASSESSMENT   Patient started stabilization exercises today with no c/o pain.     PLAN    Review and update written HEP next visit. Manual therapy as appropriate. Progress to site specific strengthening/stabilization exercises as tolerated. Plan of care changed to flexion. Will reassess in June when patient returns from vacation.    Effective Dates/Duration: 06/25/2023 TO 09/23/2023 (90 days).    Frequency:  1-2 per week      GOALS   Short term goals to be met by 08/06/2023  (6 weeks):  1. Pt will have a MMT improvement in strength to  lumbar paraspinals and B LE by 1/2 grade to allow for improved ADL performance.   2. Pt will report a 30% - 50% improvement in back pain with self care activities, ADL's   3. Pt will report/demonstrate an improvement of standing tolerance to 15 minutes or greater to allow for ADL's such as cooking, washing, personal care.  4. Pt will tolerate improved walking tolerance by 15 minutes or greater to allow for grocery shopping and community independence.  5. Pt will have an improved ability to bend over and pick up a light object, don shoes/socks on and off      Long term goals to be met by 09/23/2023 (90 days):  1. Pt will report 50%-80% or greater improvement in back pain with self care activities, ADL's.  2. Pt will report/demonstrate an improvement of back pain with side lying to allow for restful sleep.   3. Pt will tolerate improved walking tolerance by 30 minutes or greater to allow for grocery shopping and community independence.  4.Pt will have an improved ability to bend over and pick up a light weight/objects with min to manageable back and or PLOF.   5. Pt will report improvement in sleeping tolerance as evidenced by reduced wake up times especially with bed mobility, rolling over in bed with manageable back/hip pain and or PLOF.   6. Pt will report independence with min to manageable back pain with basic home management to allow for improved laundry management and ADL's.  7. Pt will be independent with updated and modified HEP.  8. Pt will be independent with pain management strategies.      [facility]

## 2023-08-19 ENCOUNTER — Encounter
Admit: 2023-08-19 | Discharge: 2023-08-19 | Payer: BLUE CROSS/BLUE SHIELD | Attending: Rehabilitative and Restorative Service Providers" | Primary: Internal Medicine

## 2023-08-19 DIAGNOSIS — M6281 Muscle weakness (generalized): Secondary | ICD-10-CM

## 2023-08-19 NOTE — Progress Notes (Signed)
 [name]  [address]  [place] [facility] [address]  Dept: [phone]      Physical Therapy Discharge     Referring MD: Cornelius Dill *  Diagnosis:     ICD-10-CM    1. Muscle weakness (generalized)  M62.81       2. Decreased activity tolerance  R68.89       3. Chronic right-sided low back pain with right-sided sciatica  M54.41     G89.29          Spine Surgery Date (if applicable):   Prior Spine Surgery: None    Prior Orthopedic Surgeries: ankle fracture  Therapy precautions:None  Co-morbidities affecting plan of care: None    Payor: [place] [employer] /  /  /     Billing pattern: Commercial- substantial/midpoint time each CPT  Total Timed Procedure Codes: 40 min   Total Time: 40 min    Modifier needed: No  Episode visit count:  9     PERTINENT MEDICAL HISTORY   Medications: reviewed in chart  Past medical and surgical history:   Past Medical History:   Diagnosis Date    Arthritis     Dyslipidemia     MRSA infection     x2 on leg last episode 4 years ago      Past Surgical History:   Procedure Laterality Date    ANESTH,SURGERY OF SHOULDER Right     arthroscopy    ORTHOPEDIC SURGERY      left ankle    PR UNLISTED PROCEDURE ABDOMEN PERITONEUM & OMENTUM      chole     Allergies:   Allergies   Allergen Reactions    Pravastatin Myalgia     Other reaction(s): Myalgia, Myalgia-Intolerance    Methylprednisolone Hives     Patient states this only happened once with a taper.  Taken prior stable doses for several days with no reaction. No prior reactions with steroid injections in the past.    Other reaction(s): Hives/Swelling-Allergy   Patient states this only happened once with a taper.  Taken prior stable doses for several days with no reaction. No prior reactions with steroid injections in the past.   Patient states this only happened once with a taper.  Taken prior stable doses for several days with no reaction. No prior reactions with steroid injections in the past.    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 Symptoms include right buttock pain radiating to the front lower leg. These symptoms seem to follow a R sided L3-4 and L4-5 distribution, affecting primarily the L4 and L5 nerve roots respectively.     History of Present Illness: How did symptoms start: Unknown cause. She did report having back pain for years and has gotten worse in the last year or so.  Reports pain radiates to the R leg. Does not go below the knee. Radiating pain with prolonged sitting, Pain is bilateral lumbar paraspinals, worse on R.   Described current symptoms as: numb, ache, dull, spasms     SUBJECTIVE   Reports some days pain is not as bad.     Patient Stated Goals: ideas of exercises to help me manage my pain daily and help to stop progression  Home program compliance: Completing as prescribed    Received previous outpatient therapy? Yes;  in the last February. On and off for a while  Chief complaints/history of injury:  Date symptoms began: 2024  Lonne Roan of condition: Chronic (continuous duration > 3 months)  Primary cause of current episode: Unspecified    Pain Assessment:  Pain location: R worse than L, Bilateral Low Back. Radiating to R posterior leg  Average Pain/symptom intensity (0-10 scale)  Last 24 hours: 5/10  Last week (1-7 days): 5/10  Description: aching, dull, and numb  Aggravating factors: rolling over in bed, prolonged standing, and walking  Alleviating factors: sitting, lying down, and ice  How often do you feel symptoms? Frequently (51-75%)  How much have your symptoms interfered with daily activities? Quite a bit  Sleep: minimally disturbed (less than 6 hours sleep) and wakes 4-5 times/night    Prior Level Of Function: Independent and active without physical limitations and participated in self directed walking program, play tennis  Current Level Of  Function:   The Patient-Specific Functional Scale:         (0 - unable             10 - able  to perform activity)  Activity: 06/25/23 08/19/23   1. Difficulty/pain with bending over - pick up a light object off floor  - flexion to extension 1/10  5/10   2. Difficulty/pain with walking tolerance >15 minutes 1/10  10/10 (limit 60 min)   3. Difficulty/pain with bed mobility, supine <-> sit, rolling over in bed, finding a comfortable position  1/10  1/10   4. Difficulty/pain with side lying   1/10  1/10   Average score:  4/4  17/4    1  4.25   Interpretation of Score: The Patient-Specific functional scale is used to quantify activity limitation and measure functional outcome for patients with any orthopaedic condition. Each activity is scored 0-10, 0 representing unable to perform activity and 10 able to perform activity at the same level as before injury or problem. Minimum detectable change is 2 points for average score and 3 points for single activity score.       OBJECTIVE EXAMINATION     Pain relieving position: flexion, SB L rotation R   Functional Outcome Questionnaire:     Findings 06/25/2023  08/19/2023     Modified Oswestry Low Back Pain Disability Questionnaire 13/50= 26% 17/50=34%     Observation/Palpation:    Posture: Increased thoracic kyphosis, Decreased lumbar lordosis, and anteriorly translated pelvis  Cervical: decreased lordosis, forward head    Scoliosis: R (+) posterior rib hump  Thoracic curve: RIGHT. Lumbar curve: LEFT. Concave: LEFT   Thoracic:  Hypomobile. Flexed  Pelvis: Rotated RIGHT  RIGHT Posterior Rotation   RIGHT flare: OUT  Innominate:  RIGHT ASIS higher than LEFT   Sacrum: Extended (+) sacral sitting  Knee: Tibial Torsion: RIGHT internal  Hip: RIGHT: Femur in IR    Foot: Supinated RIGHT   Muscle Imbalance/Weakness: RIGHT > LEFT: Hip flexors, Glut med, max, pelvic rotators, quad, hams, paraspinals  Soft Tissue Tightness: LEFT Hip flexors, hams, gastroc soleus, paraspinals    AROM:  (within  pain tolerance)   COMMENTS   LUMBAR  06/25/23   08/19/23     FLEX: (40  60)  20       40   Tightness at [facility]   EXT: (20  35 )  10       10   Tightness at [facility]    RIGHT LEFT RIGHT LEFT    Side Bending (  15  20)  10  15       WFL       WFL  Tightness at [facility]   Rotation (3- 18  )  5  5       WFL      WFL   Tightness at [facility]   Thoracic SB  Limited  Limited   Tightness at [facility]   Thoracic ROT Limited Limited   Tightness at [facility]   Hip External Rot Limited Limited   Tightness at [facility]   Hip Internal Rot Limited Limited   Tightness at [facility]     GROSS MMT:  LUMBAR/HIP   (within pain tolerance) 06/25/23   08/19/23    FLEXION 3/5  4/5    EXTENSION 3/5  4/5     RIGHT LEFT   RIGHT   LEFT   SIDE BENDING 3/5 3/5 4/5 4/5   ROTATION 3/5 3/5 4/5 4/5    RIGHT LEFT   RIGHT    LEFT   Hip EXTENSION 3/5 3/5 4/5 4/5   Hip ABDUCTION 3/5 3/5 4/5 4/5   Hip INTERNAL ROT 3/5 3/5 4/5 4/5   HIP EXTERNAL ROT 3/5 3/5 4/5 4/5     Palpation/Joint Mobility:   LUMBAR JOINT MOBILITY       L1 hypomobile   L2 hypomobile      L3 hypomobile   L4 hypomobile   L5 hypomobile   [place] hypomobile   SACRUM  hypomobile   Hypertonic lumbar paraspinals noted  No TTP to lumbar paraspinals noted     DERMATOME/MYOTOME:  WFL  REFLEXES: WNL    Neuro Screen: denies numbness, tingling, and radiating pain  Bowel/bladder function: normal   Sensory/Abdominal/saddle sensation: normal    Function:   Gait: no gross deviations.   Stair management:    WNL as per patient report.  Sit to stand: Shifts weight to LEFT, Leans to LEFT   Bed Mobility: Guarded    Special Tests:  SLR's Test:  Negative  Heel Walking (L4-L5): Negative  Toe Walking (S1-S2): Negative   Prone Instability Test: Positive  Passive Lumbar Extension Test: Negative    Treatment provided today consisted of:   Therapeutic exercise (97110) x 40 min to address ROM/strength deficits and to develop a HEP as noted below.  Current Exercises Past Exercises (not performed today)     Mini sit to stand from a  regular chair.   Progression: sit up from lower chair   On side: leg lifts   On elbows: leg kicks  Bird dogs    OVER THE DOOR STRAPS   PROGRESSION:   step further away from door  change to a heavier band    LIGHT BAND (Top of the door)    Shoulder pull-downs with knee march  Chops   Punches  Overhead presses     BLUE BAND (below waist or thigh) - middle of the door    Squats or mini squats - like sitting back on chair  Sidestep   Sidestep with high knee march on leg close to door  Forward/backward step   Alternating step forward high knee march     LIGHT BAND (around ankles)    Forward step   Backward step   Bend over hip extension  Sidekick or sidestep  Shoulder presses - press shoulders up feet stepping on band    Reviewed FOAM ROLL exercises  I, Y and W, elbow press, chest lifts  [employer]  reaches/morning stretches  Diagonals using light band (sash or like drawing a sword)  "T" - Pull light band apart    Alternating leg lifts with stomach squeezes    Reviewed and issued written HEP. Demonstrates independence in performance        Patient Education on the condition/pathology, involved anatomy, and exercise rationale.  MOVEMENT/HOME/ADL  MODIFICATION:  stretch R side during [employer]ates    ASSESSMENT   Start of care: 06/25/2023   Progress Report Period: 06/25/23 to 08/19/23   As of 08/19/2023, Cuca Benassi has attended 9 PT sessions.  Pt's attendance has been consistent with plan of care.  Pt has progressed slower than anticipated with treatment.  She has met 3/5 short term goals and 3/8 long term goals. Patient continues to have the same back pain which improved some after injection. That injection did not last very long. She did have a core and B LE weakness which is improved. ODI score is worse from 26% to 34%. Pt has  not made significant gains in PT .  A written HEP issued to patient. Getting injection in July.     PLAN     Discharge with continued HEP.    [x]  All POC signed:  Go to Notes / Go  to Scanned Items  [x]  Send message to Auth team to close referral: Go to InBox   [x]  Episode Resolved:  Go to Episode  [x]  All remaining visits canceled:  Go to Appt Desk  Future Appointments   Date Time Provider Department Center   10/03/2023  1:45 PM Judith Novak, MD [facility]   10/08/2023 10:50 AM Jaymes Mew., MD [facility]     Effective Dates/Duration: 06/25/2023 TO 09/23/2023 (90 days).    Frequency:  1-2 per week      GOALS   Short term goals to be met by 08/06/2023  (6 weeks):  1. Pt will have a MMT improvement in strength to lumbar paraspinals and B LE by 1/2 grade to allow for improved ADL performance. MET  2. Pt will report a 30% - 50% improvement in back pain with self care activities, ADL's NOT MET  3. Pt will report/demonstrate an improvement of standing tolerance to 15 minutes or greater to allow for ADL's such as cooking, washing, personal care. MET  4. Pt will tolerate improved walking tolerance by 15 minutes or greater to allow for grocery shopping and community independence. MET  5. Pt will have an improved ability to bend over and pick up a light object, don shoes/socks on and off PARTIALLY MET      Long term goals to be met by 09/23/2023 (90 days):  1. Pt will report 50%-80% or greater improvement in back pain with self care activities, ADL's. NOT MET  2. Pt will report/demonstrate an improvement of back pain with side lying to allow for restful sleep. NOT MET  3. Pt will tolerate improved walking tolerance by 30 minutes or greater to allow for grocery shopping and community independence. MET  4.Pt will have an improved ability to bend over and pick up a light weight/objects with min to manageable back and or PLOF. PARTIALLY MET  5. Pt will report improvement in sleeping tolerance as evidenced by reduced wake up times especially with bed mobility, rolling over in bed with manageable back/hip pain and or PLOF. NOT MET  6. Pt will report independence with min to manageable back  pain with basic home management to  allow for improved laundry management and ADL's. PARTIALLY MET  7. Pt will be independent with updated and modified HEP. MET  8. Pt will be independent with pain management strategies. MET      [facility]

## 2023-08-26 ENCOUNTER — Encounter
Payer: BLUE CROSS/BLUE SHIELD | Attending: Rehabilitative and Restorative Service Providers" | Primary: Internal Medicine

## 2023-09-23 ENCOUNTER — Encounter: Payer: BLUE CROSS/BLUE SHIELD | Attending: Physical Medicine & Rehabilitation | Primary: Internal Medicine

## 2023-10-03 ENCOUNTER — Encounter: Payer: BLUE CROSS/BLUE SHIELD | Attending: Physical Medicine & Rehabilitation | Primary: Internal Medicine

## 2023-10-03 ENCOUNTER — Ambulatory Visit
Admit: 2023-10-03 | Discharge: 2023-10-03 | Payer: BLUE CROSS/BLUE SHIELD | Attending: Physical Medicine & Rehabilitation | Primary: Internal Medicine

## 2023-10-03 ENCOUNTER — Ambulatory Visit: Admit: 2023-10-03 | Payer: BLUE CROSS/BLUE SHIELD | Primary: Internal Medicine

## 2023-10-03 MED ORDER — TRIAMCINOLONE ACETONIDE 40 MG/ML IJ SUSP
40 | Freq: Once | INTRAMUSCULAR | Status: AC
Start: 2023-10-03 — End: 2023-10-03
  Administered 2023-10-03: 16:00:00 40 mg

## 2023-10-03 NOTE — Telephone Encounter (Signed)
 Returning your call.

## 2023-10-03 NOTE — Progress Notes (Signed)
 Name: Monique Barker  Date of Birth: 12/30/1955  Gender: female  MRN: [id number]        Transforaminal ESI Procedure Note    Procedure: Right  L3-L4 and L4-L5 transforaminal epidural steroid injections     Precautions: Alyha Marines denies prior sensitivity to steroid, local anesthetic, iodine, or shellfish.       Consent:  Consent was obtained prior to the procedure. The procedure was discussed at length with Reena Pouch. She was given the opportunity to ask questions regarding the procedure and its associated risks.  In addition to the potential risks associated with the procedure itself, the patient was informed both verbally and in writing of potential side effects of the use glucocorticoids.  The patient appeared to comprehend the informed consent and desired to have the procedure performed, and informed consent was signed.     She was placed in a prone position on the fluoroscopy table and the skin was prepped and draped in a routine sterile fashion. The areas to be injected were each anesthetized with 1 ml of 1% Lidocaine. A 22 gauge 3.5 inch spinal needle was carefully advanced under fluoroscopic guidance to the right L4-L5 transforaminal space  0.5 ml of 70% of Omnipaque was injected to confirm proper needle placement and absence of subdural or vascular flow Once proper placement was confirmed, 0.30ml of sterile water and 40 mg kenalog  were injected through the spinal needle.       The above procedure was then repeated at the Right  L3-L4 transforaminal space.    Fluoroscopic guidance was used intermittently over a 10-minute period to insure proper needle placement and her safety. A hard copy of the fluoroscopic image has been placed in her chart and is saved on the C-arm hard drive. She was monitored after the procedure and discharged home in a stable fashion with a routine follow up.    Procedural Diagnosis:     ICD-10-CM    1. Spinal stenosis of lumbar region, unspecified whether  neurogenic claudication present  M48.061 FL NERVE BLOCK LUMBOSACRAL 1ST     FL NERVE BLOCK LUMBOSACRAL EACH ADD     triamcinolone  acetonide (KENALOG -40) injection 40 mg      2. Lumbar radiculopathy  M54.16 FL NERVE BLOCK LUMBOSACRAL 1ST     FL NERVE BLOCK LUMBOSACRAL EACH ADD     triamcinolone  acetonide (KENALOG -40) injection 40 mg         Vieva Brummitt P Tajuan Dufault, MD  10/03/23

## 2023-10-03 NOTE — Telephone Encounter (Signed)
 Call returned and patient has been scheduled for her injection.

## 2023-10-08 ENCOUNTER — Encounter: Payer: BLUE CROSS/BLUE SHIELD | Primary: Internal Medicine

## 2023-11-07 ENCOUNTER — Ambulatory Visit: Admit: 2023-11-07 | Discharge: 2023-11-07 | Payer: BLUE CROSS/BLUE SHIELD | Primary: Internal Medicine

## 2023-11-07 DIAGNOSIS — M5416 Radiculopathy, lumbar region: Principal | ICD-10-CM

## 2024-01-24 ENCOUNTER — Encounter

## 2024-02-05 ENCOUNTER — Encounter: Payer: BLUE CROSS/BLUE SHIELD | Attending: Physical Medicine & Rehabilitation | Primary: Internal Medicine

## 2024-02-10 ENCOUNTER — Encounter: Payer: BLUE CROSS/BLUE SHIELD | Primary: Internal Medicine

## 2024-02-11 ENCOUNTER — Ambulatory Visit: Admit: 2024-02-11 | Payer: BLUE CROSS/BLUE SHIELD | Primary: Internal Medicine

## 2024-02-11 ENCOUNTER — Ambulatory Visit
Admit: 2024-02-11 | Discharge: 2024-02-11 | Payer: BLUE CROSS/BLUE SHIELD | Attending: Physical Medicine & Rehabilitation | Primary: Internal Medicine

## 2024-02-11 DIAGNOSIS — M5416 Radiculopathy, lumbar region: Principal | ICD-10-CM

## 2024-02-11 MED ORDER — TRIAMCINOLONE ACETONIDE 40 MG/ML IJ SUSP
40 | Freq: Once | INTRAMUSCULAR | Status: AC
Start: 2024-02-11 — End: 2024-02-11
  Administered 2024-02-11: 19:00:00 40 mg

## 2024-02-11 MED ORDER — PREGABALIN 50 MG PO CAPS
50 | ORAL_CAPSULE | Freq: Three times a day (TID) | ORAL | 2 refills | 30.00000 days | Status: AC
Start: 2024-02-11 — End: 2024-03-12

## 2024-02-11 NOTE — Progress Notes (Signed)
"              Name: Monique Barker  Date of Birth: Oct 23, 1955  Gender: female  MRN: [id number]        Transforaminal ESI Procedure Note    Procedure: Right  L3-L4 and L4-L5 transforaminal epidural steroid injections     Precautions: Danashia Landers denies prior sensitivity to steroid, local anesthetic, iodine, or shellfish.       Consent:  Consent was obtained prior to the procedure. The procedure was discussed at length with Reena Pouch. She was given the opportunity to ask questions regarding the procedure and its associated risks.  In addition to the potential risks associated with the procedure itself, the patient was informed both verbally and in writing of potential side effects of the use glucocorticoids.  The patient appeared to comprehend the informed consent and desired to have the procedure performed, and informed consent was signed.     She was placed in a prone position on the fluoroscopy table and the skin was prepped and draped in a routine sterile fashion. The areas to be injected were each anesthetized with 1 ml of 1% Lidocaine. A 22 gauge 3.5 inch spinal needle was carefully advanced under fluoroscopic guidance to the right L3-L4 transforaminal space  0.5 ml of 70% of Omnipaque was injected to confirm proper needle placement and absence of subdural or vascular flow Once proper placement was confirmed, 0.16ml of  sterile water and 40 mg kenalog  were injected through the spinal needle.       The above procedure was then repeated at the Right  L4-L5 transforaminal space.    Fluoroscopic guidance was used intermittently over a 10-minute period to insure proper needle placement and her safety. A hard copy of the fluoroscopic image has been placed in her chart and is saved on the C-arm hard drive. She was monitored after the procedure and discharged home in a stable fashion with a routine follow up.    Procedural Diagnosis:     ICD-10-CM    1. Lumbar radiculopathy  M54.16 FL NERVE BLOCK LUMBOSACRAL 1ST      FL NERVE BLOCK LUMBOSACRAL EACH ADD     triamcinolone  acetonide (KENALOG -40) injection 40 mg     pregabalin  (LYRICA ) 50 MG capsule      2. Spinal stenosis of lumbar region, unspecified whether neurogenic claudication present  M48.061 FL NERVE BLOCK LUMBOSACRAL 1ST     FL NERVE BLOCK LUMBOSACRAL EACH ADD     triamcinolone  acetonide (KENALOG -40) injection 40 mg     pregabalin  (LYRICA ) 50 MG capsule      3. Lumbar spondylosis  M47.816 FL NERVE BLOCK LUMBOSACRAL 1ST     FL NERVE BLOCK LUMBOSACRAL EACH ADD     triamcinolone  acetonide (KENALOG -40) injection 40 mg     pregabalin  (LYRICA ) 50 MG capsule      4. Degeneration of intervertebral disc of lumbar region, unspecified whether pain present  M51.369 FL NERVE BLOCK LUMBOSACRAL 1ST     FL NERVE BLOCK LUMBOSACRAL EACH ADD     triamcinolone  acetonide (KENALOG -40) injection 40 mg     pregabalin  (LYRICA ) 50 MG capsule         Jerianne Anselmo SHAUNNA LUNGER, MD  02/11/24   "

## 2024-02-24 ENCOUNTER — Ambulatory Visit: Admit: 2024-02-24 | Discharge: 2024-02-24 | Payer: BLUE CROSS/BLUE SHIELD | Primary: Internal Medicine

## 2024-02-24 DIAGNOSIS — M5416 Radiculopathy, lumbar region: Principal | ICD-10-CM

## 2024-02-27 NOTE — Progress Notes (Signed)
 "    Name: Monique Barker  Date of Birth: Aug 14, 1955  Gender: female  MRN: [id number]  Age: 68 y.o.      History of Present Illness  The patient is a 68 year old female who presents today for follow-up of a recent lumbar spine injection. She had a right L3-4 and right L4-5 transforaminal epidural steroid injection with Dr. Delon Lunger on 02/11/2024. She has a history of some right lower extremity radicular symptoms related to lumbar stenosis. She says that prior to the injection, it was mainly hurting in her right buttock and thigh. But following the injection, she has had 100% pain relief which she is very excited about.               Medications:       Current Outpatient Medications:     pregabalin  (LYRICA ) 50 MG capsule, Take 1 capsule by mouth 3 times daily for 30 days. Max Daily Amount: 150 mg, Disp: 90 capsule, Rfl: 2    Coenzyme Q10 100 MG TABS, Take by mouth, Disp: , Rfl:     Estradiol (VAGIFEM) 10 MCG TABS vaginal tablet, Insert one tablet vaginally twice weekly at night., Disp: , Rfl:     rosuvastatin (CRESTOR) 5 MG tablet, Take 5 mg by mouth, Disp: , Rfl:     CALCIUM PO, Take by mouth, Disp: , Rfl:     CRANBERRY PO, Take by mouth, Disp: , Rfl:     TURMERIC PO, Take by mouth (Patient not taking: Reported on 10/25/2020), Disp: , Rfl:     ZINC PO, Take by mouth, Disp: , Rfl:     ascorbic acid (VITAMIN C) 500 MG tablet, Take by mouth daily (Patient not taking: Reported on 10/25/2020), Disp: , Rfl:     diclofenac (VOLTAREN) 75 MG EC tablet, Take by mouth, Disp: , Rfl:     fexofenadine (ALLEGRA) 180 MG tablet, Take by mouth, Disp: , Rfl:     hydroCHLOROthiazide (HYDRODIURIL) 12.5 MG tablet, Take 12.5 mg by mouth daily (Patient not taking: Reported on 10/25/2020), Disp: , Rfl:     montelukast (SINGULAIR) 10 MG tablet, Take 10 mg by mouth, Disp: , Rfl:     omeprazole (PRILOSEC) 20 MG delayed release capsule, Take 20 mg by mouth daily, Disp: , Rfl:     Allergies:  Allergies   Allergen Reactions    Pravastatin  Myalgia     Other reaction(s): Myalgia, Myalgia-Intolerance    Methylprednisolone Hives     Patient states this only happened once with a taper.  Taken prior stable doses for several days with no reaction. No prior reactions with steroid injections in the past.    Other reaction(s): Hives/Swelling-Allergy   Patient states this only happened once with a taper.  Taken prior stable doses for several days with no reaction. No prior reactions with steroid injections in the past.   Patient states this only happened once with a taper.  Taken prior stable doses for several days with no reaction. No prior reactions with steroid injections in the past.    Evolocumab      Other Reaction(s): Hives/Swelling-Allergy         Physical Exam  Neurological: No gross deficit on exam. Motor function is grossly intact in the bilateral lower extremities at all levels.             Results          Diagnosis:      ICD-10-CM    1. Lumbar radiculopathy  M54.16       2. Spinal stenosis of lumbar region, unspecified whether neurogenic claudication present  M48.061           Assessment & Plan  1. Post-lumbar spine injection follow-up:  She reports 100% pain relief following the right L3-4 and right L4-5 transforaminal epidural steroid injection performed on 02/11/2024. There are no gross deficits on examination, and motor function is intact in the bilateral lower extremities at all levels.     Conservative management will be continued. She will contact the office if she requires another injection or if her symptoms return.  In the future I would recommend a repeat right sided L3-4 and L4-5 transforaminal epidural steroid injection.  She has done extensive physical therapy in the past which has not seemed to provide lasting relief.     Her most recent MRI of the lumbar spine that we have reviewed with her previously shows mainly stenosis on the right side at L3-4 and L4-5, both levels which is severe.  She would likely be a candidate in the  future for a right sided L3-4 and L4-5 hemilaminectomy.  She should continue taking Voltaren 75 mg tablets as well as Lyrica  50 mg tablets.    PROCEDURE  The patient had a right L3-4 and right L4-5 transforaminal epidural steroid injection on 02/11/2024.                                Electronically Signed by Debby Blair Gwenyth Mickey, MD  02/27/24  3:53 PM                    "
